# Patient Record
Sex: Male | Born: 1940 | Race: White | Hispanic: No | Marital: Married | Smoking: Former smoker
Health system: Southern US, Community
[De-identification: ages and names within clinical notes are randomized; demographics above are authoritative.]

## PROBLEM LIST (undated history)

## (undated) DIAGNOSIS — I2510 Atherosclerotic heart disease of native coronary artery without angina pectoris: Secondary | ICD-10-CM

## (undated) DIAGNOSIS — R7301 Impaired fasting glucose: Secondary | ICD-10-CM

## (undated) DIAGNOSIS — F418 Other specified anxiety disorders: Secondary | ICD-10-CM

## (undated) DIAGNOSIS — K279 Peptic ulcer, site unspecified, unspecified as acute or chronic, without hemorrhage or perforation: Secondary | ICD-10-CM

## (undated) DIAGNOSIS — M5136 Other intervertebral disc degeneration, lumbar region: Secondary | ICD-10-CM

## (undated) DIAGNOSIS — M19019 Primary osteoarthritis, unspecified shoulder: Secondary | ICD-10-CM

## (undated) DIAGNOSIS — K635 Polyp of colon: Secondary | ICD-10-CM

## (undated) DIAGNOSIS — I10 Essential (primary) hypertension: Secondary | ICD-10-CM

## (undated) DIAGNOSIS — J3081 Allergic rhinitis due to animal (cat) (dog) hair and dander: Secondary | ICD-10-CM

## (undated) DIAGNOSIS — K861 Other chronic pancreatitis: Secondary | ICD-10-CM

## (undated) DIAGNOSIS — M51369 Other intervertebral disc degeneration, lumbar region without mention of lumbar back pain or lower extremity pain: Secondary | ICD-10-CM

## (undated) DIAGNOSIS — J189 Pneumonia, unspecified organism: Secondary | ICD-10-CM

## (undated) DIAGNOSIS — N3289 Other specified disorders of bladder: Secondary | ICD-10-CM

## (undated) DIAGNOSIS — E785 Hyperlipidemia, unspecified: Secondary | ICD-10-CM

## (undated) HISTORY — DX: Impaired fasting glucose: R73.01

## (undated) HISTORY — DX: Pneumonia, unspecified organism: J18.9

## (undated) HISTORY — DX: Polyp of colon: K63.5

## (undated) HISTORY — DX: Hyperlipidemia, unspecified: E78.5

## (undated) HISTORY — DX: Other specified anxiety disorders: F41.8

## (undated) HISTORY — DX: Allergic rhinitis due to animal (cat) (dog) hair and dander: J30.81

## (undated) HISTORY — DX: Primary osteoarthritis, unspecified shoulder: M19.019

## (undated) HISTORY — DX: Other intervertebral disc degeneration, lumbar region without mention of lumbar back pain or lower extremity pain: M51.369

## (undated) HISTORY — PX: OTHER SURGICAL HISTORY: SHX169

## (undated) HISTORY — PX: APPENDECTOMY: SHX54

## (undated) HISTORY — DX: Other specified disorders of bladder: N32.89

## (undated) HISTORY — PX: CHOLECYSTECTOMY: SHX55

## (undated) HISTORY — DX: Essential (primary) hypertension: I10

## (undated) HISTORY — DX: Other chronic pancreatitis: K86.1

## (undated) HISTORY — DX: Peptic ulcer, site unspecified, unspecified as acute or chronic, without hemorrhage or perforation: K27.9

## (undated) HISTORY — DX: Other intervertebral disc degeneration, lumbar region: M51.36

## (undated) HISTORY — DX: Atherosclerotic heart disease of native coronary artery without angina pectoris: I25.10

## (undated) HISTORY — PX: ABDOMINAL EXPLORATION SURGERY: SHX538

---

## 1989-09-05 HISTORY — PX: HIATAL HERNIA REPAIR: SHX195

## 2004-03-15 ENCOUNTER — Encounter: Payer: Self-pay | Admitting: Family Medicine

## 2009-02-27 ENCOUNTER — Encounter: Payer: Self-pay | Admitting: Cardiology

## 2009-06-26 ENCOUNTER — Encounter: Payer: Self-pay | Admitting: Family Medicine

## 2009-08-05 HISTORY — PX: OTHER SURGICAL HISTORY: SHX169

## 2009-08-07 ENCOUNTER — Encounter: Payer: Self-pay | Admitting: Cardiology

## 2009-08-10 ENCOUNTER — Encounter: Payer: Self-pay | Admitting: Cardiology

## 2009-08-11 ENCOUNTER — Encounter: Payer: Self-pay | Admitting: Cardiology

## 2009-08-21 ENCOUNTER — Encounter: Payer: Self-pay | Admitting: Cardiology

## 2009-08-25 ENCOUNTER — Encounter: Payer: Self-pay | Admitting: Cardiology

## 2010-02-02 ENCOUNTER — Ambulatory Visit: Payer: Self-pay | Admitting: Family Medicine

## 2010-02-02 ENCOUNTER — Encounter: Admission: RE | Admit: 2010-02-02 | Discharge: 2010-02-02 | Payer: Self-pay | Admitting: Family Medicine

## 2010-02-02 DIAGNOSIS — F32A Depression, unspecified: Secondary | ICD-10-CM | POA: Insufficient documentation

## 2010-02-02 DIAGNOSIS — F329 Major depressive disorder, single episode, unspecified: Secondary | ICD-10-CM

## 2010-02-03 ENCOUNTER — Encounter: Payer: Self-pay | Admitting: Family Medicine

## 2010-02-04 LAB — CONVERTED CEMR LAB
ALT: 19 units/L (ref 0–53)
AST: 19 units/L (ref 0–37)
BUN: 10 mg/dL (ref 6–23)
Creatinine, Ser: 0.95 mg/dL (ref 0.40–1.50)
Glucose, Bld: 106 mg/dL — ABNORMAL HIGH (ref 70–99)
HDL: 43 mg/dL (ref >39)
Hemoglobin: 14.1 g/dL (ref 13.0–17.0)
MCHC: 32.6 g/dL (ref 30.0–36.0)
MCV: 89.3 fL (ref 78.0–100.0)
Potassium: 4.5 meq/L (ref 3.5–5.3)
RBC: 4.85 M/uL (ref 4.22–5.81)
RDW: 15 % (ref 11.5–15.5)
Total Protein: 6.9 g/dL (ref 6.0–8.3)
Triglycerides: 112 mg/dL (ref <150)
WBC: 3.9 10*3/uL — ABNORMAL LOW (ref 4.0–10.5)

## 2010-02-14 ENCOUNTER — Ambulatory Visit: Payer: Self-pay | Admitting: Family Medicine

## 2010-02-14 ENCOUNTER — Inpatient Hospital Stay (HOSPITAL_COMMUNITY): Admission: EM | Admit: 2010-02-14 | Discharge: 2010-02-21 | Payer: Self-pay | Admitting: Emergency Medicine

## 2010-02-14 ENCOUNTER — Encounter: Payer: Self-pay | Admitting: Family Medicine

## 2010-02-14 DIAGNOSIS — G4700 Insomnia, unspecified: Secondary | ICD-10-CM | POA: Insufficient documentation

## 2010-02-15 ENCOUNTER — Telehealth (INDEPENDENT_AMBULATORY_CARE_PROVIDER_SITE_OTHER): Payer: Self-pay | Admitting: *Deleted

## 2010-02-24 ENCOUNTER — Ambulatory Visit: Payer: Self-pay | Admitting: Family Medicine

## 2010-02-24 DIAGNOSIS — K861 Other chronic pancreatitis: Secondary | ICD-10-CM | POA: Insufficient documentation

## 2010-02-25 ENCOUNTER — Encounter: Payer: Self-pay | Admitting: Family Medicine

## 2010-02-26 LAB — CONVERTED CEMR LAB
AST: 20 units/L (ref 0–37)
Albumin: 4.4 g/dL (ref 3.5–5.2)
Alkaline Phosphatase: 101 units/L (ref 39–117)
BUN: 18 mg/dL (ref 6–23)
CO2: 28 meq/L (ref 19–32)
Calcium: 9.3 mg/dL (ref 8.4–10.5)
Glucose, Bld: 108 mg/dL — ABNORMAL HIGH (ref 70–99)
Lipase: 203 units/L — ABNORMAL HIGH (ref 0–75)
Total Protein: 7 g/dL (ref 6.0–8.3)

## 2010-03-03 ENCOUNTER — Encounter: Payer: Self-pay | Admitting: Family Medicine

## 2010-03-06 ENCOUNTER — Emergency Department (HOSPITAL_COMMUNITY): Admission: EM | Admit: 2010-03-06 | Discharge: 2010-03-06 | Payer: Self-pay | Admitting: Emergency Medicine

## 2010-03-10 ENCOUNTER — Encounter: Payer: Self-pay | Admitting: Family Medicine

## 2010-03-17 ENCOUNTER — Encounter: Payer: Self-pay | Admitting: Family Medicine

## 2010-03-18 ENCOUNTER — Encounter: Payer: Self-pay | Admitting: Family Medicine

## 2010-03-19 ENCOUNTER — Encounter: Payer: Self-pay | Admitting: Family Medicine

## 2010-03-24 ENCOUNTER — Encounter: Payer: Self-pay | Admitting: Family Medicine

## 2010-03-31 ENCOUNTER — Encounter: Payer: Self-pay | Admitting: Cardiology

## 2010-03-31 ENCOUNTER — Ambulatory Visit: Payer: Self-pay | Admitting: Cardiology

## 2010-04-06 ENCOUNTER — Telehealth (INDEPENDENT_AMBULATORY_CARE_PROVIDER_SITE_OTHER): Payer: Self-pay | Admitting: *Deleted

## 2010-04-15 ENCOUNTER — Encounter: Payer: Self-pay | Admitting: Family Medicine

## 2010-04-20 ENCOUNTER — Encounter: Payer: Self-pay | Admitting: Family Medicine

## 2010-05-04 ENCOUNTER — Encounter: Payer: Self-pay | Admitting: Family Medicine

## 2010-05-26 ENCOUNTER — Encounter: Payer: Self-pay | Admitting: Cardiology

## 2010-06-07 ENCOUNTER — Ambulatory Visit: Payer: Self-pay | Admitting: Family Medicine

## 2010-06-09 ENCOUNTER — Encounter: Payer: Self-pay | Admitting: Family Medicine

## 2010-06-10 ENCOUNTER — Encounter: Payer: Self-pay | Admitting: Family Medicine

## 2010-06-10 ENCOUNTER — Encounter: Payer: Self-pay | Admitting: Cardiology

## 2010-06-11 ENCOUNTER — Encounter: Payer: Self-pay | Admitting: Family Medicine

## 2010-06-11 ENCOUNTER — Encounter (INDEPENDENT_AMBULATORY_CARE_PROVIDER_SITE_OTHER): Payer: Self-pay | Admitting: *Deleted

## 2010-06-15 ENCOUNTER — Encounter: Payer: Self-pay | Admitting: Family Medicine

## 2010-06-30 ENCOUNTER — Encounter: Payer: Self-pay | Admitting: Family Medicine

## 2010-07-06 ENCOUNTER — Encounter: Payer: Self-pay | Admitting: Family Medicine

## 2010-07-07 ENCOUNTER — Encounter: Payer: Self-pay | Admitting: Family Medicine

## 2010-08-06 ENCOUNTER — Encounter: Payer: Self-pay | Admitting: Family Medicine

## 2010-09-15 ENCOUNTER — Ambulatory Visit
Admission: RE | Admit: 2010-09-15 | Discharge: 2010-09-15 | Payer: Self-pay | Source: Home / Self Care | Attending: Cardiology | Admitting: Cardiology

## 2010-09-15 ENCOUNTER — Encounter: Payer: Self-pay | Admitting: Cardiology

## 2010-09-24 ENCOUNTER — Encounter: Payer: Self-pay | Admitting: Family Medicine

## 2010-10-05 NOTE — Consult Note (Signed)
Summary: [facility]  [facility]   Imported By: Lanelle Bal 07/09/2010 11:10:54  _____________________________________________________________________  External Attachment:    Type:   Image     Comment:   External Document

## 2010-10-05 NOTE — Consult Note (Signed)
Summary: [facility]  [facility]   Imported By: Lanelle Bal 03/25/2010 84:16:60  _____________________________________________________________________  External Attachment:    Type:   Image     Comment:   External Document

## 2010-10-05 NOTE — Letter (Signed)
Summary: Surgical Pathology Report  Surgical Pathology Report   Imported By: Marylou Mccoy 05/07/2010 11:44:34  _____________________________________________________________________  External Attachment:    Type:   Image     Comment:   External Document

## 2010-10-05 NOTE — Letter (Signed)
Summary: [facility] - Op Note  [facility] - Op Note   Imported By: Marylou Mccoy 05/07/2010 11:38:16  _____________________________________________________________________  External Attachment:    Type:   Image     Comment:   External Document

## 2010-10-05 NOTE — Letter (Signed)
Summary: Letter to Patient with Stool Study Results/[facility]  Letter to Patient with Stool Study Results/[facility]   Imported By: Lanelle Bal 03/31/2010 09:48:54  _____________________________________________________________________  External Attachment:    Type:   Image     Comment:   External Document

## 2010-10-05 NOTE — Letter (Signed)
Summary: [facility] - Discharge Summary  [facility] - Discharge Summary   Imported By: Marylou Mccoy 05/07/2010 11:36:52  _____________________________________________________________________  External Attachment:    Type:   Image     Comment:   External Document

## 2010-10-05 NOTE — Miscellaneous (Signed)
  Clinical Lists Changes  Observations: Added new observation of COLONOSCOPY: Location:  [facility].    Normal internal hemorrhoids diverticulosis o/w normal (03/17/2010 12:10)      Colonoscopy  Procedure date:  03/17/2010  Findings:      Location:  [facility].    Normal internal hemorrhoids diverticulosis o/w normal   Colonoscopy  Procedure date:  03/17/2010  Findings:      Location:  [facility].    Normal internal hemorrhoids diverticulosis o/w normal

## 2010-10-05 NOTE — Letter (Signed)
Summary: Letter Regarding Lab Results/[facility]  Letter Regarding Lab Results/[facility]   Imported By: Lanelle Bal 07/19/2010 08:53:40  _____________________________________________________________________  External Attachment:    Type:   Image     Comment:   External Document

## 2010-10-05 NOTE — Consult Note (Signed)
Summary: [facility]  [facility]   Imported By: Lanelle Bal 06/23/2010 14:43:14  _____________________________________________________________________  External Attachment:    Type:   Image     Comment:   External Document

## 2010-10-05 NOTE — Progress Notes (Signed)
Summary: Call A Nurse  Phone Note From Other Clinic   Caller: Call A Nurse Summary of Call: [facility] Triage Call Report Triage Record Num: [id number] Operator: Karenann Cai Patient Name: Miguel Glenn Call Date & Time: 02/14/2010 12:26:59PM Patient Phone: [phone] PCP: Seymour Bars, DO Patient Gender: Male PCP Fax : [phone] Patient DOB: 27-Jan-1941 Practice Name: [facility] Reason for Call: Patient is reportng symptoms x 1 weeks. Fever: ?. Symptoms: weak, vomited x 1 today, severe abdominal cramping. Patient reports that he has lost 35 lbs within 2 months: no appetite. Patient has been falling recently. Patient has been having lightheadedness and had to pull off to the side of the road when driving. RN advised patient and his girlfriend to have patient evaluated today at [facility] ER. Protocol(s) Used: Abdominal Pain / Discomfort Recommended Outcome per Protocol: Activate EMS 911 Override Outcome if Used in Protocol: See ED Immediately RN Reason for Override Outcome: Nursing Judgement Used. Reason for Outcome: Over 70 years of age AND new onset (first episode) unbearable back or abdominal pain Care Advice: Write down provider's name. List or place the following in a bag for transport with the patient: current prescription and/or OTC medications; alternative treatments, therapies and medications; and street drugs.  ~ 02/14/2010 12:41:07PM Page 1 of 1 [email or web address] Initial call taken by: Payton Spark CMA,  February 15, 2010 8:49 AM

## 2010-10-05 NOTE — Progress Notes (Signed)
  Recieved ROI via Mail today, faxed over to [facility]  April 06, 2010 10:23 AM     Appended Document:  Records recieved from [facility] gave to [facility].Marland KitchenMarland KitchenMarland KitchenKM

## 2010-10-05 NOTE — Miscellaneous (Signed)
Summary: old records  Clinical Lists Changes  Observations: Added new observation of PAST SURG HX: appy R shoulder replacement- Dr Marlyne Beards 08-2009 RTC surgery bilat Gall bladder  hiatal hernia repair 1991 ExLap in 1990s (02/25/2010 14:37) Added new observation of PNEUMVXNEXT: Not Indicated (02/25/2010 14:37) Added new observation of HDLNXTDUE: 02/04/2015 (02/25/2010 14:37) Added new observation of LDLNXTDUE: 02/04/2015 (02/25/2010 14:37) Added new observation of CREATNXTDUE: 02/04/2011 (02/25/2010 14:37) Added new observation of POTASSIUMDUE: 02/04/2011 (02/25/2010 14:37) Added new observation of PAST MED HX: HTN High chol Depression/ Anxiety - Dr Gaynell Face CAD -- Dr Jarold Motto shoulder DJD chronic pancreatitis asthma Lumbar DDD bladder spasm IFG allergies hx of colon polyhps hx of PUD / GI bleed  (02/25/2010 14:37) Added new observation of PRIMARY MD: Seymour Bars DO (02/25/2010 14:37) Added new observation of PNEUMOVAX: given (03/05/2009 14:39)       Past History:  Past Medical History: HTN High chol Depression/ Anxiety - Dr Gaynell Face CAD -- Dr Jarold Motto shoulder DJD chronic pancreatitis asthma Lumbar DDD bladder spasm IFG allergies hx of colon polyhps hx of PUD / GI bleed  Past Surgical History: appy R shoulder replacement- Dr Marlyne Beards 08-2009 RTC surgery bilat Gall bladder  hiatal hernia repair 1991 ExLap in 1990s    Pneumovax Result Date:  03/05/2009 Pneumovax Result:  given Pneumovax Next Due:  Not Indicated

## 2010-10-05 NOTE — Assessment & Plan Note (Signed)
Summary: NOV CAD/ HTN   Vital Signs:  Patient profile:   70 year old male Height:      77 inches Weight:      250 pounds BMI:     29.75 O2 Sat:      96 % on Room air Pulse rate:   54 / minute BP sitting:   124 / 76  (left arm) Cuff size:   large  Vitals Entered By: Payton Spark CMA (Feb 02, 2010 1:31 PM)  O2 Flow:  Room air CC: New to est.    Primary Care Provider:  Seymour Bars DO  CC:  New to est. .  History of Present Illness: 70 yo WM presents for NOV.  Due to [facility] change, he needed to change from [facility]).  He has hx of depression / anxiety -- followed by Dr Gaynell Face, on meds and doing well following the suicide death of wife 2 yrs ago.  He has CAD, seen by Dr Jarold Motto, s/p 3 cardiac stents, no AMIs.  Reports having a 'clean cath' about 6 mos ago. Needs new cardiologist due to [facility].  He has HTN, high cholesterol and is on Plavix long term.  He has no complaints today other than cocyx pain x 2 days following a fall in his room while standing on a chair, he fell off.     Current Medications (verified): 1)  Fluoxetine Hcl 20 Mg Tabs (Fluoxetine Hcl) .... Take 2 Tabs By Mouth Once Daily 2)  Lipitor 40 Mg Tabs (Atorvastatin Calcium) .... Take 1 Tab By Mouth At Bedtime 3)  Metoprolol Succinate 25 Mg Xr24h-Tab (Metoprolol Succinate) .... Take 1 Tab By Mouth Once Daily 4)  Amlodipine Besylate 5 Mg Tabs (Amlodipine Besylate) .... Take 1 Tab By Mouth Once Daily 5)  Diovan 320 Mg Tabs (Valsartan) .... Take 1 Tab By Mouth Once Daily 6)  Plavix 75 Mg Tabs (Clopidogrel Bisulfate) .... Take 1 Tab By Mouth Once Daily 7)  Trazodone Hcl 100 Mg Tabs (Trazodone Hcl) .... Take 1 Tab By Mouth Once Daily 8)  Clonazepam 2 Mg Tabs (Clonazepam) .... Take 1 Tab By Mouth Once Daily 9)  Zolpidem Tartrate 10 Mg Tabs (Zolpidem Tartrate) .... Take 1 Tab By Mouth At Bedtime 10)  Alprazolam 0.5 Mg Tabs (Alprazolam) .... Take 1-2 Tabs By Mouth At Bedtime 11)  Fish Oil 1000 Mg Caps (Omega-3 Fatty  Acids) 12)  Aspir-Low 81 Mg Tbec (Aspirin) 13)  Multivitamins  Tabs (Multiple Vitamin)  Allergies (verified): No Known Drug Allergies  Past History:  Past Medical History: HTN High chol Depression/ Anxiety - Dr Gaynell Face CAD -- Dr Jarold Motto shoulder DJD  Past Surgical History: appy R shoulder replacement RTC surgery bilat Gall bladder  hiatal hernia repair 1990/01/21  Family History: mother died of lung cancer at 82, high chol, HTN father died GSW sister HTN  Social History: Quit smoking in Jan 22, 1995. Has 6 kids, all grown. Was married x 2, 2nd wife died in 01-22-2008- suicide. Lives w/ girlfiend. Retired [employer]. Denies [facility]H Exerc[facility] at [facility] [employer].  Review of Systems       no fevers/sweats/+weakness, unexplained wt loss/gain, no change in vision, + difficulty hearing, ringing in ears, no hay fever/+allergies, no CP/discomfort, no palpitations, no breast lump/nipple discharge, no cough/wheeze, no blood in stool, N/V/D, no nocturia, no leaking urine, no unusual vag bleeding, no vaginal/penile discharge, + muscle/joint pain, no rash, no new/changing mole, + HA, no memory loss, + anxiety, + sleep problem, + depression, no unexplained  lumps, no easy bruising/bleeding, no concern with sexual function   Physical Exam  General:  alert, well-developed, well-nourished, and well-hydrated.   Head:  normocephalic and atraumatic.   Eyes:  pupils equal, pupils round, and pupils reactive to light.   Ears:  no external deformities.   Nose:  no nasal discharge.   Mouth:  pharynx pink and moist.   Neck:  no masses.  no audible carotid bruits Lungs:  Normal respiratory effort, chest expands symmetrically. Lungs are clear to auscultation, no crackles or wheezes. Heart:  Normal rate and regular rhythm. S1 and S2 normal without gallop, murmur, click, rub or other extra sounds. Extremities:  no LE edema Neurologic:  gait normal.   Skin:  color normal.  bruising and tenderness over the coccyx Cervical  Nodes:  No lymphadenopathy noted Psych:  good eye contact, not anxious appearing, and flat affect.     Impression & Recommendations:  Problem # 1:  CAD (ICD-414.00) Doing well on current meds.  Asymptomatic.  Needs new cardiologist due to change of insurance to [facility], Dr Jarold Motto no longer covered.  Will refer to Dr Jens Som.  Continue current meds.   His updated medication list for this problem includes:    Metoprolol Succinate 25 Mg Xr24h-tab (Metoprolol succinate) .Marland Kitchen... Take 1 tab by mouth once daily    Amlodipine Besylate 5 Mg Tabs (Amlodipine besylate) .Marland Kitchen... Take 1 tab by mouth once daily    Diovan 320 Mg Tabs (Valsartan) .Marland Kitchen... Take 1 tab by mouth once daily    Plavix 75 Mg Tabs (Clopidogrel bisulfate) .Marland Kitchen... Take 1 tab by mouth once daily    Aspir-low 81 Mg Tbec (Aspirin)  Orders: T-CBC No Diff ([id number]) T-Comprehensive Metabolic Panel ([id number]) T-Lipid Profile ([id number]) Cardiology Referral (Cardiology)  Problem # 2:  COCCYGEAL PAIN (ICD-724.79) Will XRAY to r/o coccyx fracture.  Treat pain with Tylenol as needed, soft pillow to sit on whether the xray is + or -. Orders: T-DG Sacrum/Coccyx ([id number])  Problem # 3:  ESSENTIAL HYPERTENSION, BENIGN (ICD-401.1) At goal.  Cont. current meds. His updated medication list for this problem includes:    Metoprolol Succinate 25 Mg Xr24h-tab (Metoprolol succinate) .Marland Kitchen... Take 1 tab by mouth once daily    Amlodipine Besylate 5 Mg Tabs (Amlodipine besylate) .Marland Kitchen... Take 1 tab by mouth once daily    Diovan 320 Mg Tabs (Valsartan) .Marland Kitchen... Take 1 tab by mouth once daily  BP today: 124/76  Problem # 4:  HYPERLIPIDEMIA (ICD-272.4) Update labs and continue current meds.  RFs done. His updated medication list for this problem includes:    Lipitor 40 Mg Tabs (Atorvastatin calcium) .Marland Kitchen... Take 1 tab by mouth at bedtime  Complete Medication List: 1)  Fluoxetine Hcl 20 Mg Tabs (Fluoxetine hcl) .... Take 2 tabs by mouth once daily 2)   Lipitor 40 Mg Tabs (Atorvastatin calcium) .... Take 1 tab by mouth at bedtime 3)  Metoprolol Succinate 25 Mg Xr24h-tab (Metoprolol succinate) .... Take 1 tab by mouth once daily 4)  Amlodipine Besylate 5 Mg Tabs (Amlodipine besylate) .... Take 1 tab by mouth once daily 5)  Diovan 320 Mg Tabs (Valsartan) .... Take 1 tab by mouth once daily 6)  Plavix 75 Mg Tabs (Clopidogrel bisulfate) .... Take 1 tab by mouth once daily 7)  Trazodone Hcl 100 Mg Tabs (Trazodone hcl) .... Take 1 tab by mouth once daily 8)  Clonazepam 2 Mg Tabs (Clonazepam) .... Take 1 tab by mouth once daily 9)  Zolpidem Tartrate 10 Mg Tabs (  Zolpidem tartrate) .... Take 1 tab by mouth at bedtime 10)  Alprazolam 0.5 Mg Tabs (Alprazolam) .... Take 1-2 tabs by mouth at bedtime 11)  Fish Oil 1000 Mg Caps (Omega-3 fatty acids) 12)  Aspir-low 81 Mg Tbec (Aspirin) 13)  Multivitamins Tabs (Multiple vitamin)  Patient Instructions: 1)  Xray Coccyx downstairs today. 2)  Will call you with results tomorrow. 3)  Update fasting labs tomorrow morning. 4)  Will call you w/ results on Thursday. 5)  Meds RFd -- below. 6)  Set you up with Dr Jens Som for f/u heart dz.   7)  Return for f/u HTN in 4 mos. Prescriptions: LIPITOR 40 MG TABS (ATORVASTATIN CALCIUM) Take 1 tab by mouth at bedtime  #90 x 1   Entered and Authorized by:   Seymour Bars DO   Signed by:   Seymour Bars DO on 02/02/2010   Method used:   Electronically to        [facility] [phone]* (retail)       [address] [place], [place]  [address]       Ph: [phone]       Fax: [phone]   RxID:   [id number] PLAVIX 75 MG TABS (CLOPIDOGREL BISULFATE) Take 1 tab by mouth once daily  #90 x 1   Entered and Authorized by:   Seymour Bars DO   Signed by:   Seymour Bars DO on 02/02/2010   Method used:   Electronically to        [facility] [phone]* (retail)       [address] [place], [place]  [address]       Ph: [phone]       Fax: [phone]   RxID:    [id number] DIOVAN 320 MG TABS (VALSARTAN) Take 1 tab by mouth once daily  #90 x 1   Entered and Authorized by:   Seymour Bars DO   Signed by:   Seymour Bars DO on 02/02/2010   Method used:   Electronically to        [facility] [phone]* (retail)       [address] [place], [place]  [address]       Ph: [phone]       Fax: [phone]   RxID:   [id number] AMLODIPINE BESYLATE 5 MG TABS (AMLODIPINE BESYLATE) Take 1 tab by mouth once daily  #90 x 1   Entered and Authorized by:   Seymour Bars DO   Signed by:   Seymour Bars DO on 02/02/2010   Method used:   Electronically to        [facility] [phone]* (retail)       [address] [place], [place]  [address]       Ph: [phone]       Fax: [phone]   RxID:   [id number] METOPROLOL SUCCINATE 25 MG XR24H-TAB (METOPROLOL SUCCINATE) Take 1 tab by mouth once daily  #90 x 1   Entered and Authorized by:   Seymour Bars DO   Signed by:   Seymour Bars DO on 02/02/2010   Method used:   Electronically to        [facility] [phone]* (retail)       [address] [place], [place]  [address]       Ph: [phone]       Fax: [phone]   RxID:   [id number]

## 2010-10-05 NOTE — Consult Note (Signed)
Summary: [facility]  [facility]   Imported By: Lanelle Bal 07/19/2010 08:46:54  _____________________________________________________________________  External Attachment:    Type:   Image     Comment:   External Document

## 2010-10-05 NOTE — Letter (Signed)
Summary: Letter Regarding Stool Study Results/[facility]  Letter Regarding Stool Study Results/[facility]   Imported By: Lanelle Bal 07/19/2010 08:44:09  _____________________________________________________________________  External Attachment:    Type:   Image     Comment:   External Document

## 2010-10-05 NOTE — Procedures (Signed)
Summary: EGD/[facility]  EGD/[facility]   Imported By: Lanelle Bal 04/27/2010 10:26:43  _____________________________________________________________________  External Attachment:    Type:   Image     Comment:   External Document

## 2010-10-05 NOTE — Assessment & Plan Note (Signed)
Summary: f/u pancreatitis   Vital Signs:  Patient profile:   70 year old male Height:      77 inches Weight:      247 pounds BMI:     29.40 O2 Sat:      94 % on Room air Pulse rate:   78 / minute BP sitting:   111 / 69  (left arm) Cuff size:   large  Vitals Entered By: Payton Spark CMA (June 07, 2010 3:33 PM)  O2 Flow:  Room air CC: F/U.    Primary Care Provider:  Seymour Bars DO  CC:  F/U. Marland Kitchen  History of Present Illness: 70 yo WM presents for f/u visit. Since his last visit here, he was referred to Dr Rhetta Mura for his chronic pancreatitis which has been flaring up with cramping.  He is on Creon.  He is not taking anything for pain.  He denies N/V or Diarrhea.  he is having a lot of gas.  Has GI f/u tomorrow.    He saw Dr Jens Som and his BB was changed and statin was added back.   He recently had his labs rechecked thru the cardiology office.  Mood has been stable on current meds.      Current Medications (verified): 1)  Fluoxetine Hcl 20 Mg Caps (Fluoxetine Hcl) .... Take 2 Capsules in The Morning 2)  Trazodone Hcl 100 Mg Tabs (Trazodone Hcl) .... Take 1 Tab By Mouth Once Daily 3)  Clonazepam 2 Mg Tabs (Clonazepam) .... .b   2 Tablets A Bedtime 4)  Zolpidem Tartrate 10 Mg Tabs (Zolpidem Tartrate) .... Take 1 Tab By Mouth At Bedtime 5)  Aspirin Ec 325 Mg Tbec (Aspirin) .... Take One Tablet By Mouth Daily 6)  Multivitamins  Tabs (Multiple Vitamin) 7)  Creon 6000 Unit Cpep (Pancrelipase (Lip-Prot-Amyl)) .... Take 3 X A Day With Meals 8)  Amlodipine Besylate 5 Mg Tabs (Amlodipine Besylate) .... Take One Tablet By Mouth Daily 9)  Zenpep 20000 Unit Cpep (Pancrelipase (Lip-Prot-Amyl)) .... Take 2 Cap Before Meals 10)  Acetaminophen Pm 500-25 Mg Tabs (Diphenhydramine-Apap (Sleep)) .... As Needed 11)  Diovan 320 Mg Tabs (Valsartan) .... Take One Tablet By Mouth Daily 12)  Pravastatin Sodium 80 Mg Tabs (Pravastatin Sodium) .Marland Kitchen.. 1 Once Daily 13)  Atenolol 25 Mg Tabs (Atenolol)  .Marland Kitchen.. 1 Once Daily  Allergies (verified): No Known Drug Allergies  Past History:  Past Medical History: HTN Hyperlipidemia Depression/ Anxiety CAD (stents placed in [facility] previously) shoulder DJD chronic pancreatitis asthma Lumbar DDD bladder spasm IFG allergies hx of colon polyhps hx of PUD / GI bleed  urology: in [place] (sees for DRE and PSA annually). Dr Jens Som cards GI Dr Rhetta Mura  Past Surgical History: Reviewed history from 02/25/2010 and no changes required. appy R shoulder replacement- Dr Marlyne Beards 08-2009 RTC surgery bilat Gall bladder  hiatal hernia repair 1991 ExLap in 1990s  Family History: Reviewed history from 02/02/2010 and no changes required. mother died of lung cancer at 72, high chol, HTN father died GSW sister HTN  Social History: Reviewed history from 02/02/2010 and no changes required. Quit smoking in 01-29-1995. Has 6 kids, all grown. Was married x 2, 2nd wife died in Jan 29, 2008- suicide. Lives w/ girlfiend. Retired [employer]. Denies ETOH Exercises at [employer].  Review of Systems      See HPI  Physical Exam  General:  alert, well-developed, well-nourished, well-hydrated, and overweight-appearing.   Head:  normocephalic and atraumatic.   Eyes:  sclera  non icteric Mouth:  pharynx pink and moist.   Neck:  no masses.   Lungs:  Normal respiratory effort, chest expands symmetrically. Lungs are clear to auscultation, no crackles or wheezes. Heart:  Normal rate and regular rhythm. S1 and S2 normal without gallop, murmur, click, rub or other extra sounds. Abdomen:  soft, diffusely TTP with mild distenstion, tympanic to percussion.  no rigidity, no hepatomegaly, and no splenomegaly.   Extremities:  no LE edema Skin:  color normal.   Cervical Nodes:  No lymphadenopathy noted Psych:  good eye contact, not anxious appearing, and not depressed appearing.     Impression & Recommendations:  Problem # 1:  CHRONIC PANCREATITIS (ICD-577.1) He has f/u  with Dr Rhetta Mura tomorrow.  On Creon.  Having more cramping and gas which is not typical for pancreatitis.  Currently does not have anything for as needed use. Orders: T-Comprehensive Metabolic Panel ([id number])  Problem # 2:  CAD (ICD-414.00) Read thru Dr Ludwig Clarks notes.  Doing well on current meds and is asymptomatic. His updated medication list for this problem includes:    Aspirin Ec 325 Mg Tbec (Aspirin) .Marland Kitchen... Take one tablet by mouth daily    Amlodipine Besylate 5 Mg Tabs (Amlodipine besylate) .Marland Kitchen... Take one tablet by mouth daily    Diovan 320 Mg Tabs (Valsartan) .Marland Kitchen... Take one tablet by mouth daily    Atenolol 25 Mg Tabs (Atenolol) .Marland Kitchen... 1 once daily  Problem # 3:  ESSENTIAL HYPERTENSION, BENIGN (ICD-401.1) BP perfect on this regimen and he's no longer bradycardic.  Continue.  Labs UTD. His updated medication list for this problem includes:    Amlodipine Besylate 5 Mg Tabs (Amlodipine besylate) .Marland Kitchen... Take one tablet by mouth daily    Diovan 320 Mg Tabs (Valsartan) .Marland Kitchen... Take one tablet by mouth daily    Atenolol 25 Mg Tabs (Atenolol) .Marland Kitchen... 1 once daily  BP today: 111/69 Prior BP: 116/66 (03/31/2010)  Labs Reviewed: K+: 4.0 (02/25/2010) Creat: : 1.13 (02/25/2010)   Chol: 107 (02/03/2010)   HDL: 43 (02/03/2010)   LDL: 42 (02/03/2010)   TG: 112 (02/03/2010)  Problem # 4:  DEPRESSION (ICD-311) Mood stable on the following regimen.  Will continue.  Pt will need to decide either to f/u with pych as previously seeing or f/u here for PHQ 9 testing. His updated medication list for this problem includes:    Fluoxetine Hcl 20 Mg Caps (Fluoxetine hcl) .Marland Kitchen... Take 2 capsules in the morning    Trazodone Hcl 100 Mg Tabs (Trazodone hcl) .Marland Kitchen... Take 1 tab by mouth once daily    Clonazepam 2 Mg Tabs (Clonazepam) .Marland Kitchen... 1/2 tab by mouth at bedtime  Complete Medication List: 1)  Fluoxetine Hcl 20 Mg Caps (Fluoxetine hcl) .... Take 2 capsules in the morning 2)  Trazodone Hcl 100 Mg Tabs (Trazodone  hcl) .... Take 1 tab by mouth once daily 3)  Clonazepam 2 Mg Tabs (Clonazepam) .... 1/2 tab by mouth at bedtime 4)  Zolpidem Tartrate 10 Mg Tabs (Zolpidem tartrate) .... Take 1 tab by mouth at bedtime 5)  Aspirin Ec 325 Mg Tbec (Aspirin) .... Take one tablet by mouth daily 6)  Multivitamins Tabs (Multiple vitamin) 7)  Creon 6000 Unit Cpep (Pancrelipase (lip-prot-amyl)) .... Take 3 x a day with meals 8)  Amlodipine Besylate 5 Mg Tabs (Amlodipine besylate) .... Take one tablet by mouth daily 9)  Zenpep 20000 Unit Cpep (Pancrelipase (lip-prot-amyl)) .... Take 2 cap before meals 10)  Acetaminophen Pm 500-25 Mg Tabs (Diphenhydramine-apap (sleep)) .Marland KitchenMarland KitchenMarland Kitchen  As needed 11)  Diovan 320 Mg Tabs (Valsartan) .... Take one tablet by mouth daily 12)  Pravastatin Sodium 80 Mg Tabs (Pravastatin sodium) .Marland Kitchen.. 1 once daily 13)  Atenolol 25 Mg Tabs (Atenolol) .Marland Kitchen.. 1 once daily  Patient Instructions: 1)  Add fasting chemistry panel to next blood draw. 2)  Have them fax me a copy.   3)  Will fax note to Dr Rhetta Mura since you are seeing him tomorrow. 4)  REturn for a PHYSICAL in 4 months. Prescriptions: CLONAZEPAM 2 MG TABS (CLONAZEPAM) 1/2 tab by mouth at bedtime  #30 x 0   Entered and Authorized by:   Seymour Bars DO   Signed by:   Seymour Bars DO on 06/07/2010   Method used:   Printed then faxed to ...       [address] [phone]* (retail)       [address] [place], [place]  [address]       Ph: [phone]       Fax: [phone]   RxID:   [id number]

## 2010-10-05 NOTE — Assessment & Plan Note (Signed)
Summary: Admit H&P   Visit Type:  H&P Primary Care Provider:  Seymour Bars DO  CC:  Abdominal Pain.  History of Present Illness: Mr Miguel Glenn presents to [facility] with cramping sharp 10/10 non radiating abdominal pain starting this morning.  He also notes retching, and diarrhea.  Mr Miguel Glenn cannot think of anything he did or ate to bring on this abdominal pain.  He also notes that his diarrhea has been ongoing for several weeks and occurs after meals. He denies any blood in his stool but does note that the stool is sometimes black.  He additionally notes that his appetite has been diminishing for 3-4 weeks, and he has lost around 35 pounds in the last 2 months. His weight loss is not intentional.     Additionally Mr Miguel Glenn notes that his psychiatrist started him on Zyprexa this Thursday although he does not think this is causing his abdominal symptoms.  Mr Miguel Glenn also notes a recent fall and thinks that he is perhaps a bit more clumsy in the last few weeks.    Habits & Providers  Alcohol-Tobacco-Diet     Alcohol drinks/day: 0     Tobacco Status: quit > 6 months  Current Problems (verified): 1)  Depression  (ICD-311) 2)  Hyperlipidemia  (ICD-272.4) 3)  Essential Hypertension, Benign  (ICD-401.1) 4)  Coccygeal Pain  (ICD-724.79) 5)  Cad  (ICD-414.00)  Medications Prior to Update: 1)  Fluoxetine Hcl 20 Mg Tabs (Fluoxetine Hcl) .... Take 2 Tabs By Mouth Once Daily 2)  Lipitor 40 Mg Tabs (Atorvastatin Calcium) .... Take 1 Tab By Mouth At Bedtime 3)  Metoprolol Succinate 25 Mg Xr24h-Tab (Metoprolol Succinate) .... Take 1 Tab By Mouth Once Daily 4)  Amlodipine Besylate 5 Mg Tabs (Amlodipine Besylate) .... Take 1 Tab By Mouth Once Daily 5)  Diovan 320 Mg Tabs (Valsartan) .... Take 1 Tab By Mouth Once Daily 6)  Plavix 75 Mg Tabs (Clopidogrel Bisulfate) .... Take 1 Tab By Mouth Once Daily 7)  Trazodone Hcl 100 Mg Tabs (Trazodone Hcl) .... Take 1 Tab By Mouth Once Daily 8)  Clonazepam 2  Mg Tabs (Clonazepam) .... Take 1 Tab By Mouth Once Daily 9)  Zolpidem Tartrate 10 Mg Tabs (Zolpidem Tartrate) .... Take 1 Tab By Mouth At Bedtime 10)  Alprazolam 0.5 Mg Tabs (Alprazolam) .... 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 Has 6 kids, all grown. Was married x 2, 2nd wife died in Jan 21, 2008- suicide. Lives w/ girlfiend. Retired [employer] architect. Denies ETOH Exercises at the [employer].  Risk Factors: Smoking Status: quit > 6 months (02/14/2010)  Past Surgical History: appy R shoulder replacement RTC surgery bilat Gall bladder  hiatal hernia repair 1991 ExLap in 1990s  Social History: Smoking Status:  quit > 6 months  Review of Systems       The patient complains of anorexia, weight loss, headaches, abdominal pain, depression, and unusual weight change.  The patient denies fever, vision loss, decreased hearing, hoarseness, chest pain, syncope, dyspnea  on exertion, peripheral edema, prolonged cough, hemoptysis, melena, hematochezia, severe indigestion/heartburn, hematuria, incontinence, genital sores, muscle weakness, suspicious skin lesions, transient blindness, difficulty walking, abnormal bleeding, enlarged lymph nodes, angioedema, and testicular masses.    Physical Exam  General:  VS: T 98, Hr 51, BP 118/63, RR 18, Sats 97%ra Well appearing male in ED bed in NAD  Eyes:  EOMI, PEERL, Sclera non-icteric, conjectiva and lids clear.  Mouth:  MMM, no lesions noted. Neck:  No deformities, masses,  or tenderness noted. Lungs:  Normal respiratory effort, chest expands symmetrically. Lungs are clear to auscultation, no crackles or wheezes. Heart:  Normal rate and regular rhythm. S1 and S2 normal without gallop, murmur, click, rub or other extra sounds. Abdomen:  Non-distended, abdomen with mature verticle midline scar.  NABS Some voluntary guarding with deep palpation.  Mildly tender to deep palpation in the upper abdominal fields.  No masses noted. No rebound tenderness, nor non-tender to bed jarring.    Rectal:  No external abnormalities noted. Normal sphincter tone. No rectal masses or tenderness. Genitalia:  Testes bilaterally descended without nodularity, tenderness or masses. No scrotal masses or lesions. No penis lesions or urethral discharge. Extremities:  No clubbing, cyanosis, edema, or deformity noted. Neurologic:  No cranial nerve deficits noted. Station and gait are normal. Plantar reflexes are down-going bilaterally. DTRs are symmetrical throughout. Sensory, motor and coordinative functions appear intact.  Exception in right arm which is 4/5 strength in all motions (at baseline) Skin:  Intact without suspicious lesions or rashes Cervical Nodes:  No lymphadenopathy noted Axillary Nodes:  No palpable lymphadenopathy Inguinal Nodes:  No significant adenopathy Psych:  Oriented X3, memory intact for recent and remote, normally interactive, good eye contact, and not anxious appearing.   Additional Exam:  Labs: CBC: 8.7>14.2<179 ANC: 7.4 MCV 91 CMP: 141/4.4 107/31 15/0.91<119  Ca 9.4, Tp 7.3, Alb 4.3, AST 39 ALT 33 Tb 0.9 UA: WNL Lipase 1072 (elevated) Fecal Occult Blood: Neg KUB: Benign chest and abdomen   Impression & Recommendations:  Problem # 1:  Pancretitis Acute Pancretitis associated with a 35 lb weight loss with no other explination is concerning for malignancy. Pancretitis appears to be mild with <2 of Ranson's criteria met (age).   Diagnosis Plan Plan for CT of  abdomen with IV and oral contrast to evaluate for etiology.  Will also obtain fasting lipids, and LDH for other explinations and to complete ranson's criteria eval.  Treatment Plan: NPO status, IVF, and Dilauded for pain.    Problem # 2:  Diarrhea Unclear etiology:  Likely due to same process creating pancretitis.  If ductal obstruction would suspect a malabsorptive process producing diarrhea.  Dark stools less concerning for melena given negative heme occult stool and normal Hb.  Will follow and provide pancretic enzymes as needed when advancing diet.   Problem # 3:  CAD (ICD-414.00) Patient on ASA and Plavix.  Will decrease aspirin to 81mg . Will also provide plavix and continue statin.   His updated medication list for this problem includes:    Metoprolol Succinate 25 Mg Xr24h-tab (Metoprolol succinate) .Marland Kitchen... Take 1 tab by mouth once daily    Amlodipine Besylate 5 Mg Tabs (Amlodipine besylate) .Marland Kitchen... Take 1 tab by mouth once daily    Diovan 320 Mg Tabs (Valsartan) .Marland Kitchen... Take 1 tab by mouth once daily    Plavix 75 Mg Tabs (Clopidogrel bisulfate) .Marland Kitchen... Take 1 tab by mouth once daily    Aspir-low 81 Mg Tbec (Aspirin)  Problem # 4:  ESSENTIAL HYPERTENSION, BENIGN (ICD-401.1) Will continue current home medications in the hospital. However will reduce doses or eliminate amlodipine as needed for hypotension.   His updated medication list for this problem includes:    Metoprolol Succinate 25 Mg Xr24h-tab (Metoprolol succinate) .Marland Kitchen... Take 1 tab by mouth once daily    Amlodipine Besylate 5 Mg Tabs (Amlodipine besylate) .Marland Kitchen... Take 1 tab by mouth once daily    Diovan 320 Mg Tabs (Valsartan) .Marland Kitchen... Take 1 tab by mouth once daily  Problem # 5:  INSOMNIA (ICD-780.52) Patient with insomina from home. Currently taking Ambien, Xanax, Klonipin, and trazadone to sleep.  Pt has had recent fall.  We are concerned about the multiple benzodiazipines.  Will place on fall precautions and provide klonipin and  trazadone for insomina.   Problem # 6:  DEPRESSION (ICD-311) Pt with depression with new Zyprexa per psych. Not on medlist and not in pill bag.  Will hold on this medication and defer to psych to restart. Unlikely to be the offending agent for abdominal pain.  Will provide Fluoxetine, Klonopin and trazadone as above.   His updated medication list for this problem includes:    Fluoxetine Hcl 20 Mg Tabs (Fluoxetine hcl) .Marland Kitchen... Take 2 tabs by mouth once daily    Trazodone Hcl 100 Mg Tabs (Trazodone hcl) .Marland Kitchen... Take 1 tab by mouth once daily    Clonazepam 2 Mg Tabs (Clonazepam) .Marland Kitchen... Take 1 tab by mouth once daily    Alprazolam 0.5 Mg Tabs (Alprazolam) .Marland Kitchen... Take 1-2 tabs by mouth at bedtime  Problem # 7:  Prophylaxis Will use heparin for VTE prophylaxis as no evidence for GI bleed.  Will also use IV protonix for ulcer prophylaxis.  Problem # 8:  Code Status: Full Code  Dictation Code [phone]  Problem # 9:  Disposition Following evaluation and resumption of diet.   Complete Medication List: 1)  Fluoxetine Hcl 20 Mg Tabs (Fluoxetine hcl) .... Take 2 tabs by mouth once daily 2)  Lipitor 40 Mg Tabs (Atorvastatin calcium) .... Take 1 tab by mouth at bedtime 3)  Metoprolol Succinate 25 Mg Xr24h-tab (Metoprolol succinate) .... Take 1 tab by mouth once daily 4)  Amlodipine Besylate 5 Mg Tabs (Amlodipine besylate) .... Take 1 tab by mouth once daily 5)  Diovan 320 Mg Tabs (Valsartan) .... Take 1 tab by mouth once daily 6)  Plavix 75 Mg Tabs (Clopidogrel bisulfate) .... Take 1 tab by mouth once daily 7)  Trazodone Hcl 100 Mg Tabs (Trazodone hcl) .... Take 1 tab by mouth once daily 8)  Clonazepam 2 Mg Tabs (Clonazepam) .... Take 1 tab by mouth once daily 9)  Zolpidem Tartrate 10 Mg Tabs (Zolpidem tartrate) .... Take 1 tab by mouth at bedtime 10)  Alprazolam 0.5 Mg Tabs (Alprazolam) .... Take 1-2 tabs by mouth at bedtime 11)  Fish Oil 1000 Mg Caps (Omega-3 fatty acids) 12)  Aspir-low 81 Mg Tbec  (Aspirin) 13)  Multivitamins Tabs (Multiple vitamin)

## 2010-10-05 NOTE — Assessment & Plan Note (Signed)
Summary: HFU pancreatitis   Vital Signs:  Patient profile:   70 year old male Height:      77 inches Weight:      240 pounds BMI:     28.56 O2 Sat:      99 % on Room air Temp:     97.9 degrees F oral Pulse rate:   48 / minute BP sitting:   127 / 79  (left arm) Cuff size:   large  Vitals Entered By: Payton Spark CMA (February 24, 2010 11:03 AM)  O2 Flow:  Room air CC: Hosp F/U   Primary Care Provider:  Seymour Bars DO  CC:  Hosp F/U.  History of Present Illness: Miguel Glenn presents for HFU visit.  Admitted to [facility] from 6-12 to 6-19 for acute and chronic pancreatitis.  He ruled out for AMI given his hx of CAD.  His abd pain is much improved after bowel rest, pain meds and IV Fluids.  Diarrhea has resolved but he is on Creon which has helped.  Denies any ETOh over the past 10 yrs.  Has not seen a GI yet.  Due for a colonosocopy and to set up GI visit for chronic pancreatitis.  Protonix was added which has helped.  No changes were made to his psych meds, he sees Dr Gaynell Face for these.  He wants less expensive meds if possible.    His Lipase improved from 1072 on admission down to 95 at discharge.  LFTs were a little high but did return to normal.  His Lipitor was held.  He had a normal MRI and MRCP to rule out pancreatitic cancer given sudden wt loss.    Allergies (verified): No Known Drug Allergies  Past History:  Past Medical History: HTN High chol Depression/ Anxiety - Dr Gaynell Face CAD -- Dr Jarold Motto shoulder DJD chronic pancreatitis  Social History: Reviewed history from 02/02/2010 and no changes required. Quit smoking in Jan 20, 1995. Has 6 kids, all grown. Was married x 2, 2nd wife died in 01/20/08- suicide. Lives w/ girlfiend. Retired [employer]. Denies ETOH Exercises at [employer].  Review of Systems      See HPI  Physical Exam  General:  alert, well-developed, well-nourished, and well-hydrated.   Head:  normocephalic and atraumatic.   Eyes:  sclera non icteric Mouth:   pharynx pink and moist.   Neck:  no masses.   Lungs:  Normal respiratory effort, chest expands symmetrically. Lungs are clear to auscultation, no crackles or wheezes. Heart:  Normal rate and regular rhythm. S1 and S2 normal without gallop, murmur, click, rub or other extra sounds. Abdomen:  epigastric TTP without distension.  soft.  NABS.  No HSM. Extremities:  no LE edema Skin:  color normal.  no jaundice bruising over lower abdomen. Psych:  good eye contact, not anxious appearing, and not depressed appearing.     Impression & Recommendations:  Problem # 1:  CHRONIC PANCREATITIS (ICD-577.1) GI referral made for f/u of chronic pancreatitis and needing screening colonoscopy. Creon helping.  Diarrhea resolved.  Continue.  Will check sugar with labs to be sure it is not rising. Orders: T-Comprehensive Metabolic Panel [id number]) T-Lipase [id number]) Gastroenterology Referral (GI)  Problem # 2:  ESSENTIAL HYPERTENSION, BENIGN (ICD-401.1)  BP at goal but will keep him off the Norvasc.  Changed Toprol XL to Atenolol for cost savings and changed Diovan to Losartan for cost.  F/U in 6 wks to be sure BP is OK on this regimen. The following medications  were removed from the medication list:    Amlodipine Besylate 5 Mg Tabs (Amlodipine besylate) .Marland Kitchen... Take 1 tab by mouth once daily    Diovan 320 Mg Tabs (Valsartan) .Marland Kitchen... Take 1 tab by mouth once daily His updated medication list for this problem includes:    Atenolol 25 Mg Tabs (Atenolol) .Marland Kitchen... 1 tab by mouth daily    Losartan Potassium 100 Mg Tabs (Losartan potassium) .Marland Kitchen... 1 tab by mouth daily  BP today: 127/79 Prior BP: 124/76 (02/02/2010)  Labs Reviewed: K+: 4.5 (02/03/2010) Creat: : 0.95 (02/03/2010)   Chol: 107 (02/03/2010)   HDL: 43 (02/03/2010)   LDL: 42 (02/03/2010)   TG: 112 (02/03/2010)  Problem # 3:  HYPERLIPIDEMIA (ICD-272.4) Will check labs to be sure LFTs are stable prior to restarting his statin. His updated  medication list for this problem includes:    Lipitor 40 Mg Tabs (Atorvastatin calcium) .Marland Kitchen... Take 1 tab by mouth at bedtime  Problem # 4:  DEPRESSION (ICD-311) Stable on current meds.  Has f/u with Dr Gaynell Face. His updated medication list for this problem includes:    Fluoxetine Hcl 40 Mg Caps (Fluoxetine hcl) .Marland Kitchen... 1 capsule by mouth daily    Trazodone Hcl 100 Mg Tabs (Trazodone hcl) .Marland Kitchen... Take 1 tab by mouth once daily    Clonazepam 2 Mg Tabs (Clonazepam) .Marland Kitchen... Take 1 tab by mouth once daily    Alprazolam 0.5 Mg Tabs (Alprazolam) .Marland Kitchen... Take 1-2 tabs by mouth at bedtime  Complete Medication List: 1)  Fluoxetine Hcl 40 Mg Caps (Fluoxetine hcl) .Marland Kitchen.. 1 capsule by mouth daily 2)  Lipitor 40 Mg Tabs (Atorvastatin calcium) .... Take 1 tab by mouth at bedtime 3)  Atenolol 25 Mg Tabs (Atenolol) .Marland Kitchen.. 1 tab by mouth daily 4)  Plavix 75 Mg Tabs (Clopidogrel bisulfate) .... Take 1 tab by mouth once daily 5)  Trazodone Hcl 100 Mg Tabs (Trazodone hcl) .... Take 1 tab by mouth once daily 6)  Clonazepam 2 Mg Tabs (Clonazepam) .... Take 1 tab by mouth once daily 7)  Zolpidem Tartrate 10 Mg Tabs (Zolpidem tartrate) .... Take 1 tab by mouth at bedtime 8)  Alprazolam 0.5 Mg Tabs (Alprazolam) .... Take 1-2 tabs by mouth at bedtime 9)  Fish Oil 1000 Mg Caps (Omega-3 fatty acids) 10)  Aspir-low 81 Mg Tbec (Aspirin) 11)  Multivitamins Tabs (Multiple vitamin) 12)  Losartan Potassium 100 Mg Tabs (Losartan potassium) .Marland Kitchen.. 1 tab by mouth daily 13)  Creon 6000 Unit Cpep (Pancrelipase (lip-prot-amyl)) .... Take 3 x a day with meals 14)  Vicodin 5-500 Mg Tabs (Hydrocodone-acetaminophen) .Marland Kitchen.. 1 tab by mouth daily as needed for pain  Patient Instructions: 1)  Change Diovan to Losartan. 2)  Change Toprol XL (metoprolol) to Atenolol. 3)  Stay off Lipitor until I get your labs back. 4)  GI referral made. 5)  Change Oxycodone to Hydrocodone for pain. 6)  Stay on Creon. 7)  Start a MVI daily. Prescriptions: VICODIN  5-500 MG TABS (HYDROCODONE-ACETAMINOPHEN) 1 tab by mouth daily as needed for pain  #20 x 0   Entered and Authorized by:   Seymour Bars DO   Signed by:   Seymour Bars DO on 02/24/2010   Method used:   Printed then faxed to ...       [address] [phone]* (retail)       [address] [place], [place]  [address]       Ph: [phone]  Fax: [phone]   RxID:   [id number] ATENOLOL 25 MG TABS (ATENOLOL) 1 tab by mouth daily  #30 x 3   Entered and Authorized by:   Seymour Bars DO   Signed by:   Seymour Bars DO on 02/24/2010   Method used:   Electronically to        [facility] [phone]* (retail)       [address] [place], [place]  [address]       Ph: [phone]       Fax: [phone]   RxID:   [id number] LOSARTAN POTASSIUM 100 MG TABS (LOSARTAN POTASSIUM) 1 tab by mouth daily  #30 x 3   Entered and Authorized by:   Seymour Bars DO   Signed by:   Seymour Bars DO on 02/24/2010   Method used:   Electronically to        [facility] [phone]* (retail)       [address] [place], [place]  [address]       Ph: [phone]       Fax: [phone]   RxID:   [id number]

## 2010-10-05 NOTE — Letter (Signed)
Summary: Letter with Colonoscopy Results/[facility]  Letter with Colonoscopy Results/[facility]   Imported By: Lanelle Bal 03/29/2010 08:45:45  _____________________________________________________________________  External Attachment:    Type:   Image     Comment:   External Document

## 2010-10-05 NOTE — Letter (Signed)
Summary: [facility] - Discharge Summary  [facility] - Discharge Summary   Imported By: Marylou Mccoy 05/07/2010 11:37:33  _____________________________________________________________________  External Attachment:    Type:   Image     Comment:   External Document

## 2010-10-05 NOTE — Consult Note (Signed)
Summary: [facility]  [facility]   Imported By: Lanelle Bal 03/19/2010 12:02:21  _____________________________________________________________________  External Attachment:    Type:   Image     Comment:   External Document

## 2010-10-05 NOTE — Letter (Signed)
Summary: [facility]  [facility]   Imported By: Maryln Gottron 08/13/2010 11:28:39  _____________________________________________________________________  External Attachment:    Type:   Image     Comment:   External Document

## 2010-10-05 NOTE — Letter (Signed)
Summary: [facility] - [facility]  [facility], Main Office  [address]   [place], [place] [address]   Phone: [phone]  Fax: [phone]     June 11, 2010 MRN: [id number]   Miguel Glenn [address] [place], [place]  [address]   Dear [name],  We have reviewed your cholesterol results.  They are as follows:     Total Cholesterol:    126 (Desirable: less than 200)       HDL  Cholesterol:     47 (Desirable: greater than 40 for men and 50 for women)       LDL Cholesterol:       59  (Desirable: less than 100 for low risk and less than 70 for moderate to high risk)       Triglycerides:       99  (Desirable: less than 150)  Our recommendations include:These numbers look good. Continue on the same medicine. Liver function is normal. Take care, Dr. Darel Hong.    Call our office at the number listed above if you have any questions.  Lowering your LDL cholesterol is important, but it is only one of a large number of "risk factors" that may indicate that you are at risk for heart disease, stroke or other complications of hardening of the arteries.  Other risk factors include:   A.  Cigarette Smoking* B.  High Blood Pressure* C.  Obesity* D.   Low HDL Cholesterol (see yours above)* E.   Diabetes Mellitus (higher risk if your is uncontrolled) F.  Family history of premature heart disease G.  Previous history of stroke or cardiovascular disease    *These are risk factors YOU HAVE CONTROL OVER.  For more information, visit .  There is now evidence that lowering the TOTAL CHOLESTEROL AND LDL CHOLESTEROL can reduce the risk of heart disease.  The American Heart Association recommends the following guidelines for the treatment of elevated cholesterol:  1.  If there is now current heart disease and less than two risk factors, TOTAL CHOLESTEROL should be less than 200 and LDL CHOLESTEROL should be less than 100. 2.  If there is current heart disease or  two or more risk factors, TOTAL CHOLESTEROL should be less than 200 and LDL CHOLESTEROL should be less than 70.  A diet low in cholesterol, saturated fat, and calories is the cornerstone of treatment for elevated cholesterol.  Cessation of smoking and exercise are also important in the management of elevated cholesterol and preventing vascular disease.  Studies have shown that 30 to 60 minutes of physical activity most days can help lower blood pressure, lower cholesterol, and keep your weight at a healthy level.  Drug therapy is used when cholesterol levels do not respond to therapeutic lifestyle changes (smoking cessation, diet, and exercise) and remains unacceptably high.  If medication is started, it is important to have you levels checked periodically to evaluate the need for further treatment options.  Thank you,    [facility] Team

## 2010-10-05 NOTE — Consult Note (Signed)
Summary: [facility]  [facility]   Imported By: Lanelle Bal 06/25/2010 15:40:35  _____________________________________________________________________  External Attachment:    Type:   Image     Comment:   External Document

## 2010-10-05 NOTE — Letter (Signed)
Summary: Letter Regarding Lab Results/[facility]  Letter Regarding Lab Results/[facility]   Imported By: Lanelle Bal 06/23/2010 12:29:14  _____________________________________________________________________  External Attachment:    Type:   Image     Comment:   External Document

## 2010-10-05 NOTE — Letter (Signed)
Summary: [facility]   Imported By: Lanelle Bal 05/18/2010 10:34:56  _____________________________________________________________________  External Attachment:    Type:   Image     Comment:   External Document

## 2010-10-05 NOTE — Assessment & Plan Note (Signed)
Summary: [facility]   Visit Type:  Initial Consult Primary Provider:  Seymour Bars DO  CC:  Establish cardiology.  History of Present Illness: 70 year old male for evaluation of coronary disease. Patient has had stents placed previously in [place]. I do not have those records available. He now would prefer  to be followed here. Note he denies any orthopnea, PND, pedal edema, palpitations, syncope or chest pain. He does have some dyspnea on exertion relieved with rest. There is no associated symptoms. This is a chronic issue.  Current Medications (verified): 1)  Fluoxetine Hcl 20 Mg Caps (Fluoxetine Hcl) .... Take 2 Capsules in The Morning 2)  Atenolol 25 Mg Tabs (Atenolol) .Marland Kitchen.. 1 Tab By Mouth Daily 3)  Trazodone Hcl 100 Mg Tabs (Trazodone Hcl) .... Take 1 Tab By Mouth Once Daily 4)  Clonazepam 2 Mg Tabs (Clonazepam) .... .b   2 Tablets A Bedtime 5)  Zolpidem Tartrate 10 Mg Tabs (Zolpidem Tartrate) .... Take 1 Tab By Mouth At Bedtime 6)  Aspirin Ec 325 Mg Tbec (Aspirin) .... Take One Tablet By Mouth Daily 7)  Multivitamins  Tabs (Multiple Vitamin) 8)  Losartan Potassium 100 Mg Tabs (Losartan Potassium) .Marland Kitchen.. 1 Tab By Mouth Daily 9)  Creon 6000 Unit Cpep (Pancrelipase (Lip-Prot-Amyl)) .... Take 3 X A Day With Meals 10)  Amlodipine Besylate 5 Mg Tabs (Amlodipine Besylate) .... Take One Tablet By Mouth Daily 11)  Zenpep 20000 Unit Cpep (Pancrelipase (Lip-Prot-Amyl)) .... Take 2 Cap Before Meals 12)  Acetaminophen Pm 500-25 Mg Tabs (Diphenhydramine-Apap (Sleep)) .... As Needed 13)  Diovan 320 Mg Tabs (Valsartan) .... Take One Tablet By Mouth Daily 14)  Metoprolol Succinate 25 Mg Xr24h-Tab (Metoprolol Succinate) .... Take One Tablet By Mouth Daily  Allergies (verified): No Known Drug Allergies  Past History:  Past Medical History: HTN Hyperlipidemia Depression/ Anxiety CAD (stents placed in [facility] previously) shoulder DJD chronic pancreatitis asthma Lumbar  DDD bladder spasm IFG allergies hx of colon polyhps hx of PUD / GI bleed  Past Surgical History: Reviewed history from 02/25/2010 and no changes required. appy R shoulder replacement- Dr Marlyne Beards 08-2009 RTC surgery bilat Gall bladder  hiatal hernia repair 1991 ExLap in 1990s  Family History: Reviewed history from 02/02/2010 and no changes required. mother died of lung cancer at 49, high chol, HTN father died GSW sister HTN  Social History: Reviewed history from 02/02/2010 and no changes required. Quit smoking in 02-13-95. Has 6 kids, all grown. Was married x 2, 2nd wife died in February 13, 2008- suicide. Lives w/ girlfiend. Retired [employer]. Denies ETOH Exercises at the [employer].  Review of Systems       no fevers or chills, productive cough, hemoptysis, dysphasia, odynophagia, melena, hematochezia, dysuria, hematuria, rash, seizure activity, orthopnea, PND, pedal edema, claudication. Remaining systems are negative.   Vital Signs:  Patient profile:   70 year old male Height:      77 inches Weight:      237 pounds BMI:     28.21 Pulse rate:   46 / minute Pulse rhythm:   regular Resp:     18 per minute BP sitting:   116 / 66  (left arm) Cuff size:   large  Vitals Entered By: Vikki Ports (March 31, 2010 3:06 PM)  Physical Exam  General:  Well developed/well nourished in NAD Skin warm/dry Patient not depressed No peripheral clubbing Back-normal HEENT-normal/normal eyelids Neck supple/normal carotid upstroke bilaterally; no bruits; no JVD; no thyromegaly chest - CTA/ normal expansion CV -  Bradycardic but regular/normal S1 and S2; no murmurs, rubs or gallops; no rub; PMI nondisplaced Abdomen -NT/ND, no HSM, no mass, + bowel sounds, no bruit 2+ femoral pulses, no bruits Ext-no edema, chords, 2+ DP Neuro-grossly nonfocal     EKG  Procedure date:  03/31/2010  Findings:      Marked sinus bradycardia at a rate of 46. No ST changes. First degree AV block.  Impression &  Recommendations:  Problem # 1:  BRADYCARDIA (ICD-427.89)  Discontinue beta blockers. The following medications were removed from the medication list:    Atenolol 25 Mg Tabs (Atenolol) .Marland Kitchen... 1 tab by mouth daily    Plavix 75 Mg Tabs (Clopidogrel bisulfate) .Marland Kitchen... Take 1 tab by mouth once daily His updated medication list for this problem includes:    Aspirin Ec 325 Mg Tbec (Aspirin) .Marland Kitchen... Take one tablet by mouth daily    Amlodipine Besylate 5 Mg Tabs (Amlodipine besylate) .Marland Kitchen... Take one tablet by mouth daily    Atenolol 25 Mg Tabs (Atenolol) .Marland Kitchen... 1 once daily  Orders: EKG w/ Interpretation (93000)  Problem # 2:  HYPERLIPIDEMIA (ICD-272.4) Resume Pravachol 40 mg p.o. daily. Check lipids and liver in 6 weeks. The following medications were removed from the medication list:    Lipitor 40 Mg Tabs (Atorvastatin calcium) .Marland Kitchen... Take 1 tab by mouth at bedtime His updated medication list for this problem includes:    Pravastatin Sodium 40 Mg Tabs (Pravastatin sodium) .Marland Kitchen... 1 once daily  Problem # 3:  ESSENTIAL HYPERTENSION, BENIGN (ICD-401.1) Blood pressure controlled on present medications. Note the patient is known two ARBs. I will discontinue his Cozaar and continue Diovan. He will follow his blood pressure at home. If it increases we will add additional medication. The following medications were removed from the medication list:    Atenolol 25 Mg Tabs (Atenolol) .Marland Kitchen... 1 tab by mouth daily    Losartan Potassium 100 Mg Tabs (Losartan potassium) .Marland Kitchen... 1 tab by mouth daily His updated medication list for this problem includes:    Aspirin Ec 325 Mg Tbec (Aspirin) .Marland Kitchen... Take one tablet by mouth daily    Amlodipine Besylate 5 Mg Tabs (Amlodipine besylate) .Marland Kitchen... Take one tablet by mouth daily    Diovan 320 Mg Tabs (Valsartan) .Marland Kitchen... Take one tablet by mouth daily    Atenolol 25 Mg Tabs (Atenolol) .Marland Kitchen... 1 once daily  Problem # 4:  CAD (ICD-414.00) Continue aspirin, Plavix, ARB and resume statin.  I will request outside records concerning previous coronary disease, percutaneous interventions and most recent stress test. The following medications were removed from the medication list:    Atenolol 25 Mg Tabs (Atenolol) .Marland Kitchen... 1 tab by mouth daily    Plavix 75 Mg Tabs (Clopidogrel bisulfate) .Marland Kitchen... Take 1 tab by mouth once daily His updated medication list for this problem includes:    Aspirin Ec 325 Mg Tbec (Aspirin) .Marland Kitchen... Take one tablet by mouth daily    Amlodipine Besylate 5 Mg Tabs (Amlodipine besylate) .Marland Kitchen... Take one tablet by mouth daily    Atenolol 25 Mg Tabs (Atenolol) .Marland Kitchen... 1 once daily  Patient Instructions: 1)  Your physician recommends that you schedule a follow-up appointment in: 6 MONTH  WITH DR Jens Som Pollock OFFICE 2)  Your physician has recommended you make the following change in your medication: STOP LOSARTAN AND TOPROL 3)  START PRAVASTATIN 40 MG  1 once daily  4)  Your physician recommends that you return for lab work in: FASTING  LIPID LIVER 272. V58.69  Prescriptions: PRAVASTATIN SODIUM 40 MG TABS (PRAVASTATIN SODIUM) 1 once daily  #90 x 3   Entered by:   Scherrie Bateman, LPN   Authorized by:   Ferman Hamming, MD, [facility]   Signed by:   Scherrie Bateman, LPN on 16/06/9603   Method used:   Electronically to        [facility] [phone]* (retail)       [address] [place], [place]  [address]       Ph: [phone]       Fax: [phone]   RxID:   [id number]

## 2010-10-05 NOTE — Letter (Signed)
Summary: Letter to Patient with Path Results/[facility]  Letter to Patient with Path Results/[facility]   Imported By: Lanelle Bal 03/26/2010 13:36:46  _____________________________________________________________________  External Attachment:    Type:   Image     Comment:   External Document

## 2010-10-05 NOTE — Consult Note (Signed)
Summary: [facility]  [facility]   Imported By: Lanelle Bal 05/12/2010 08:59:18  _____________________________________________________________________  External Attachment:    Type:   Image     Comment:   External Document

## 2010-10-07 NOTE — Assessment & Plan Note (Signed)
Summary: [facility]   Visit Type:  Follow-up Primary Provider:  Seymour Bars DO  CC:  No complaints.  History of Present Illness: Pleasant male for followup of coronary disease. Patient has had stents placed previously in [place]. I do not have those records available. There is note in outside records that he had normal catheterization in July of 2010. Last seen here in July of 2011. Since then, the patient denies any dyspnea on exertion, orthopnea, PND, pedal edema, palpitations, syncope or chest pain.   Current Medications (verified): 1)  Fluoxetine Hcl 20 Mg Caps (Fluoxetine Hcl) .... Take 2 Capsules in The Morning 2)  Trazodone Hcl 100 Mg Tabs (Trazodone Hcl) .... Take 1 Tab By Mouth Once Daily 3)  Zolpidem Tartrate 10 Mg Tabs (Zolpidem Tartrate) .... Take 1 Tab By Mouth At Bedtime 4)  Aspirin Ec 325 Mg Tbec (Aspirin) .... Take One Tablet By Mouth Daily 5)  Multivitamins  Tabs (Multiple Vitamin) 6)  Amlodipine Besylate 5 Mg Tabs (Amlodipine Besylate) .... Take One Tablet By Mouth Daily 7)  Zenpep 20000 Unit Cpep (Pancrelipase (Lip-Prot-Amyl)) .... Take 2 Cap Before Meals 8)  Acetaminophen Pm 500-25 Mg Tabs (Diphenhydramine-Apap (Sleep)) .... As Needed 9)  Diovan 320 Mg Tabs (Valsartan) .... Take One Tablet By Mouth Daily 10)  Pravastatin Sodium 80 Mg Tabs (Pravastatin Sodium) .Marland Kitchen.. 1 Once Daily 11)  Dicyclomine Hcl 20 Mg Tabs (Dicyclomine Hcl) .... Take 1 Tablet By Mouth Once A Day 12)  Alprazolam 0.5 Mg Tabs (Alprazolam) .... Take 1 Tablet By Mouth Two Times A Day  Allergies (verified): No Known Drug Allergies  Past History:  Past Medical History: Reviewed history from 06/07/2010 and no changes required. HTN Hyperlipidemia Depression/ Anxiety CAD (stents placed in [facility] previously) shoulder DJD chronic pancreatitis asthma Lumbar DDD bladder spasm IFG allergies hx of colon polyhps hx of PUD / GI bleed  urology: in [place] (sees for DRE and PSA  annually). Dr Jens Som cards GI Dr Rhetta Mura  Past Surgical History: Reviewed history from 02/25/2010 and no changes required. appy R shoulder replacement- Dr Marlyne Beards 08-2009 RTC surgery bilat Gall bladder  hiatal hernia repair 1991 ExLap in 1990s  Social History: Reviewed history from 02/02/2010 and no changes required. Quit smoking in 01/28/1995. Has 6 kids, all grown. Was married x 2, 2nd wife died in 2008/01/28- suicide. Lives w/ girlfiend. Retired [employer]. Denies ETOH Exercises at [employer].  Review of Systems       no fevers or chills, productive cough, hemoptysis, dysphasia, odynophagia, melena, hematochezia, dysuria, hematuria, rash, seizure activity, orthopnea, PND, pedal edema, claudication. Remaining systems are negative.   Vital Signs:  Patient profile:   70 year old male Height:      77 inches Weight:      253 pounds BMI:     30.11 Pulse rate:   67 / minute Pulse rhythm:   regular Resp:     18 per minute BP sitting:   122 / 77  (right arm) Cuff size:   large  Vitals Entered By: Vikki Ports (September 15, 2010 3:00 PM)  Physical Exam  General:  Well-developed well-nourished in no acute distress.  Skin is warm and dry.  HEENT is normal.  Neck is supple. No thyromegaly.  Chest is clear to auscultation with normal expansion.  Cardiovascular exam is regular rate and rhythm.  Abdominal exam nontender or distended. No masses palpated. Extremities LUE in sling from recent rotator cuff surgery neuro grossly intact    EKG  Procedure date:  09/15/2010  Findings:      Sinus rhythm at a rate of 61. No significant ST changes.  Impression & Recommendations:  Problem # 1:  CAD (ICD-414.00) Continue aspirin and statin. The following medications were removed from the medication list:    Atenolol 25 Mg Tabs (Atenolol) .Marland Kitchen... 1 once daily His updated medication list for this problem includes:    Aspirin Ec 325 Mg Tbec (Aspirin) .Marland Kitchen... Take one tablet by mouth daily     Amlodipine Besylate 5 Mg Tabs (Amlodipine besylate) .Marland Kitchen... Take one tablet by mouth daily    Lisinopril 40 Mg Tabs (Lisinopril) .Marland Kitchen... Take one tablet by mouth daily  Problem # 2:  ESSENTIAL HYPERTENSION, BENIGN (ICD-401.1) Blood pressure controlled on present medications. He would like to change Diovan secondary to cost. I will discontinue this medication when he completes his present prescription. We will then begin lisinopril milligrams p.o. daily. Check potassium and renal function one week after beginning lisinopril. The following medications were removed from the medication list:    Atenolol 25 Mg Tabs (Atenolol) .Marland Kitchen... 1 once daily His updated medication list for this problem includes:    Aspirin Ec 325 Mg Tbec (Aspirin) .Marland Kitchen... Take one tablet by mouth daily    Amlodipine Besylate 5 Mg Tabs (Amlodipine besylate) .Marland Kitchen... Take one tablet by mouth daily    Lisinopril 40 Mg Tabs (Lisinopril) .Marland Kitchen... Take one tablet by mouth daily  Problem # 3:  HYPERLIPIDEMIA (ICD-272.4) Continue statin. Lipids and liver monitored by primary care. His updated medication list for this problem includes:    Pravastatin Sodium 80 Mg Tabs (Pravastatin sodium) .Marland Kitchen... 1 once daily  Patient Instructions: 1)  Your physician has recommended you make the following change in your medication: WHEN FINISHED WITH DIOVAN-START LISINOPRIL 40MG  ONCE DAILY 2)  Your physician wants you to follow-up in: ONE YEAR  You will receive a reminder letter in the mail two months in advance. If you don't receive a letter, please call our office to schedule the follow-up appointment. Prescriptions: LISINOPRIL 40 MG TABS (LISINOPRIL) Take one tablet by mouth daily  #90 x 4   Entered by:   Deliah Goody, RN   Authorized by:   Ferman Hamming, MD, [facility]   Signed by:   Deliah Goody, RN on 09/15/2010   Method used:   Electronically to        [facility] [phone]* (retail)       [address] [place], [place]  [address]       Ph:  [phone]       Fax: [phone]   RxID:   [id number]

## 2010-10-08 ENCOUNTER — Encounter: Payer: Self-pay | Admitting: Family Medicine

## 2010-10-12 ENCOUNTER — Encounter: Payer: Self-pay | Admitting: Family Medicine

## 2010-10-21 ENCOUNTER — Encounter: Payer: Self-pay | Admitting: Family Medicine

## 2010-10-27 ENCOUNTER — Encounter: Payer: Self-pay | Admitting: Family Medicine

## 2010-10-27 ENCOUNTER — Encounter (INDEPENDENT_AMBULATORY_CARE_PROVIDER_SITE_OTHER): Payer: Medicare PPO | Admitting: Family Medicine

## 2010-10-27 DIAGNOSIS — Z0000 Encounter for general adult medical examination without abnormal findings: Secondary | ICD-10-CM

## 2010-10-27 NOTE — Letter (Signed)
Summary: [facility]   [facility]   Imported By: Kassie Mends 10/21/2010 08:28:08  _____________________________________________________________________  External Attachment:    Type:   Image     Comment:   External Document

## 2010-10-28 ENCOUNTER — Ambulatory Visit
Admission: RE | Admit: 2010-10-28 | Discharge: 2010-10-28 | Disposition: A | Discharge status: Home / Self Care | Payer: Medicare PPO | Source: Ambulatory Visit | Attending: Gastroenterology | Admitting: Gastroenterology

## 2010-10-28 ENCOUNTER — Encounter: Payer: Self-pay | Admitting: Family Medicine

## 2010-10-28 ENCOUNTER — Other Ambulatory Visit: Payer: Self-pay | Admitting: Gastroenterology

## 2010-10-28 DIAGNOSIS — K859 Acute pancreatitis without necrosis or infection, unspecified: Secondary | ICD-10-CM

## 2010-10-29 ENCOUNTER — Encounter: Payer: Self-pay | Admitting: Family Medicine

## 2010-11-02 NOTE — Assessment & Plan Note (Signed)
Summary: CPE   Vital Signs:  Patient profile:   70 year old male Height:      77 inches Weight:      239 pounds BMI:     28.44 O2 Sat:      96 % on Room air Pulse rate:   64 / minute BP sitting:   106 / 75  (left arm) Cuff size:   large  Vitals Entered By: Payton Spark CMA (October 27, 2010 12:58 PM)  O2 Flow:  Room air CC: CPE   Primary Care Provider:  Seymour Bars DO  CC:  CPE.  History of Present Illness: 70 yo WM with hx of chronic pancreatitis s/p recent 14 day hospitalization for chronic pancreatitis the end of Jan presents for CPE.  He is doing better but has lost weight given recent events.  He has not been able to eat in the past 3-4 wks.  He has just started to advance to a soft mechanical diet.  Abd pain has improved but he is needing to take Oxycontin per Dr Rhetta Mura.  he has f/u with them tomorrow and sees Dr Jens Som for hx of CAD.  Denies CP or DOE.  He is trying to stay hydrated.  Denies N/V/D.  Denies constipation from medicine.  He is drinking 2 cans of ensure each day.    He Pneumovax was updated 2 yrs ago and his tetanus is UTD.     Current Medications (verified): 1)  Fluoxetine Hcl 20 Mg Caps (Fluoxetine Hcl) .... Take 2 Capsules in The Morning 2)  Trazodone Hcl 100 Mg Tabs (Trazodone Hcl) .... Take 1 Tab By Mouth Once Daily 3)  Zolpidem Tartrate 10 Mg Tabs (Zolpidem Tartrate) .... Take 1 Tab By Mouth At Bedtime 4)  Aspirin Ec 325 Mg Tbec (Aspirin) .... Take One Tablet By Mouth Daily 5)  Multivitamins  Tabs (Multiple Vitamin) 6)  Amlodipine Besylate 5 Mg Tabs (Amlodipine Besylate) .... Take One Tablet By Mouth Daily 7)  Zenpep 20000 Unit Cpep (Pancrelipase (Lip-Prot-Amyl)) .... Take 2 Cap Before Meals 8)  Acetaminophen Pm 500-25 Mg Tabs (Diphenhydramine-Apap (Sleep)) .... As Needed 9)  Lisinopril 40 Mg Tabs (Lisinopril) .... Take One Tablet By Mouth Daily 10)  Pravastatin Sodium 80 Mg Tabs (Pravastatin Sodium) .Marland Kitchen.. 1 Once Daily 11)  Dicyclomine Hcl 20 Mg  Tabs (Dicyclomine Hcl) .... Take 1 Tablet By Mouth Once A Day 12)  Alprazolam 0.5 Mg Tabs (Alprazolam) .... Take 1 Tablet By Mouth Two Times A Day  Allergies (verified): No Known Drug Allergies  Past History:  Past Medical History: Reviewed history from 06/07/2010 and no changes required. HTN Hyperlipidemia Depression/ Anxiety CAD (stents placed in [facility] previously) shoulder DJD chronic pancreatitis asthma Lumbar DDD bladder spasm IFG allergies hx of colon polyhps hx of PUD / GI bleed  urology: in [place] (sees for DRE and PSA annually). Dr Jens Som cards GI Dr Rhetta Mura  Past Surgical History: Reviewed history from 02/25/2010 and no changes required. appy R shoulder replacement- Dr Marlyne Beards 08-2009 RTC surgery bilat Gall bladder  hiatal hernia repair 1991 ExLap in 1990s  Family History: Reviewed history from 02/02/2010 and no changes required. mother died of lung cancer at 53, high chol, HTN father died GSW sister HTN  Social History: Reviewed history from 02/02/2010 and no changes required. Quit smoking in 01/21/1995. Has 6 kids, all grown. Was married x 2, 2nd wife died in 01/21/2008- suicide. Lives w/ girlfiend. Retired [employer]. Denies ETOH Exercises at the [employer].  Review  of Systems       The patient complains of anorexia and abdominal pain.  The patient denies fever, weight loss, weight gain, vision loss, decreased hearing, hoarseness, chest pain, syncope, dyspnea on exertion, peripheral edema, prolonged cough, headaches, hemoptysis, melena, hematochezia, severe indigestion/heartburn, hematuria, incontinence, genital sores, muscle weakness, suspicious skin lesions, transient blindness, difficulty walking, depression, unusual weight change, abnormal bleeding, enlarged lymph nodes, angioedema, breast masses, and testicular masses.    Physical Exam  General:  alert, well-developed, well-nourished, and well-hydrated.   Head:  normocephalic and atraumatic.   Eyes:   sclera non icteric Ears:  no external deformities.   Nose:  no nasal discharge.   Mouth:  pharynx pink and moist and fair dentition.   Neck:  no masses.  no audible carotid bruits Lungs:  Normal respiratory effort, chest expands symmetrically. Lungs are clear to auscultation, no crackles or wheezes. Heart:  Normal rate and regular rhythm. S1 and S2 normal without gallop, murmur, click, rub or other extra sounds. Abdomen:  soft, non-tender, normal bowel sounds, no distention, no masses, no guarding, no hepatomegaly, and no splenomegaly.   Pulses:  2+ radial and pedal pulses Extremities:  no LE edema Neurologic:  gait normal.   Skin:  color normal.   Cervical Nodes:  No lymphadenopathy noted Psych:  good eye contact, not anxious appearing, and not depressed appearing.     Impression & Recommendations:  Problem # 1:  HEALTH MAINTENANCE EXAM (ICD-V70.0) Keeping healthy checklist for men reviewed. BP at goal.  BMI 28= overwt. Update fasting labs. DRE and PSA today for prostate cancer screening. Colonoscopy normal 03-2010. Sees Dr Jens Som for CAD/ cardiac screening. Immunizations UTD. Continue current meds. f/u in 6 mos. GI f/u tomorrow for chronic pancreatitis.  Complete Medication List: 1)  Fluoxetine Hcl 20 Mg Caps (Fluoxetine hcl) .... Take 2 capsules in the morning 2)  Trazodone Hcl 100 Mg Tabs (Trazodone hcl) .... Take 1 tab by mouth once daily 3)  Zolpidem Tartrate 10 Mg Tabs (Zolpidem tartrate) .... Take 1 tab by mouth at bedtime 4)  Aspirin Ec 325 Mg Tbec (Aspirin) .... Take one tablet by mouth daily 5)  Multivitamins Tabs (Multiple vitamin) 6)  Amlodipine Besylate 5 Mg Tabs (Amlodipine besylate) .... Take one tablet by mouth daily 7)  Zenpep 20000 Unit Cpep (Pancrelipase (lip-prot-amyl)) .... Take 2 cap before meals 8)  Acetaminophen Pm 500-25 Mg Tabs (Diphenhydramine-apap (sleep)) .... As needed 9)  Lisinopril 40 Mg Tabs (Lisinopril) .... Take one tablet by mouth  daily 10)  Pravastatin Sodium 80 Mg Tabs (Pravastatin sodium) .Marland Kitchen.. 1 once daily 11)  Dicyclomine Hcl 20 Mg Tabs (Dicyclomine hcl) .... Take 1 tablet by mouth once a day 12)  Alprazolam 0.5 Mg Tabs (Alprazolam) .... Take 1 tablet by mouth two times a day  Other Orders: T-PSA Total ([address]) T-Comprehensive Metabolic Panel [phone]) T-Lipid Profile ([id number]) T-CBC w/Diff ([id number])  Patient Instructions: 1)  Update labs today. 2)  Will call you w/ results tomorrow. 3)  f/u with Dr Rhetta Mura for chronic pancreatitis. 4)  Use CETAPHIL cream daily after bathing for dry skin. 5)  Continue current meds. 6)  Have pharmacy fax any RF requests. 7)  Return for f/u in 6 mos. Prescriptions: PRAVASTATIN SODIUM 80 MG TABS (PRAVASTATIN SODIUM) 1 once daily  #30 x 11   Entered and Authorized by:   Seymour Bars DO   Signed by:   Seymour Bars DO on 10/27/2010   Method used:   Electronically to  [address] [phone]* (retail)       [address] [place], [place]  [address]       Ph: [phone]       Fax: [phone]   RxID:   [id number]    Orders Added: 1)  T-PSA Total [[id number]] 2)  T-Comprehensive Metabolic Panel [[id number]] 3)  T-Lipid Profile [[id number]] 4)  T-CBC w/Diff [[id number]] 5)  Est. Patient age 34&> [20254]   Immunization History:  Tetanus/Td Immunization History:    Tetanus/Td:  historical (09/05/2004)   Immunization History:  Tetanus/Td Immunization History:    Tetanus/Td:  Historical (09/05/2004)

## 2010-11-02 NOTE — Procedures (Signed)
Summary: Endoscopic US/[facility]  Endoscopic US/[facility]   Imported By: Lanelle Bal 10/27/2010 13:34:34  _____________________________________________________________________  External Attachment:    Type:   Image     Comment:   External Document

## 2010-11-02 NOTE — Op Note (Signed)
Summary: Cholangiopancreatography/[facility]  Cholangiopancreatography/[facility]   Imported By: Lanelle Bal 10/27/2010 13:36:01  _____________________________________________________________________  External Attachment:    Type:   Image     Comment:   External Document

## 2010-11-11 NOTE — Letter (Signed)
Summary: [facility] addendum  [facility] addendum   Imported By: Kassie Mends 11/04/2010 08:43:51  _____________________________________________________________________  External Attachment:    Type:   Image     Comment:   External Document

## 2010-11-17 LAB — CONVERTED CEMR LAB
ALT: 35 units/L (ref 0–53)
Albumin: 4.8 g/dL (ref 3.5–5.2)
Calcium: 10 mg/dL (ref 8.4–10.5)
Cholesterol: 105 mg/dL (ref 0–200)
HCT: 44.9 % (ref 39.0–52.0)
HDL: 30 mg/dL — ABNORMAL LOW (ref >39)
LDL Cholesterol: 50 mg/dL (ref 0–99)
Lymphocytes Relative: 35 % (ref 12–46)
Lymphs Abs: 1.9 10*3/uL (ref 0.7–4.0)
MCHC: 33.2 g/dL (ref 30.0–36.0)
Monocytes Absolute: 0.4 10*3/uL (ref 0.1–1.0)
PSA, Free Pct: 41 (ref >25)
Platelets: 207 10*3/uL (ref 150–400)
RDW: 13.1 % (ref 11.5–15.5)
VLDL: 25 mg/dL (ref 0–40)
WBC: 5.4 10*3/uL (ref 4.0–10.5)

## 2010-11-21 LAB — CBC
HCT: 40.5 % (ref 39.0–52.0)
HCT: 38.6 % — ABNORMAL LOW (ref 39.0–52.0)
Hemoglobin: 13.9 g/dL (ref 13.0–17.0)
Hemoglobin: 13.2 g/dL (ref 13.0–17.0)
MCH: 31.3 pg (ref 26.0–34.0)
MCHC: 34.3 g/dL (ref 30.0–36.0)
MCV: 91.3 fL (ref 78.0–100.0)
MCV: 91.3 fL (ref 78.0–100.0)
Platelets: 133 10*3/uL — ABNORMAL LOW (ref 150–400)
RBC: 4.44 MIL/uL (ref 4.22–5.81)
WBC: 4.2 10*3/uL (ref 4.0–10.5)
WBC: 3.8 10*3/uL — ABNORMAL LOW (ref 4.0–10.5)

## 2010-11-21 LAB — COMPREHENSIVE METABOLIC PANEL
ALT: 24 U/L (ref 0–53)
ALT: 73 U/L — ABNORMAL HIGH (ref 0–53)
Alkaline Phosphatase: 79 U/L (ref 39–117)
Alkaline Phosphatase: 124 U/L — ABNORMAL HIGH (ref 39–117)
Alkaline Phosphatase: 128 U/L — ABNORMAL HIGH (ref 39–117)
BUN: 2 mg/dL — ABNORMAL LOW (ref 6–23)
Calcium: 8.7 mg/dL (ref 8.4–10.5)
Chloride: 105 mEq/L (ref 96–112)
Creatinine, Ser: 1.27 mg/dL (ref 0.4–1.5)
Creatinine, Ser: 0.98 mg/dL (ref 0.4–1.5)
GFR calc Af Amer: >60 mL/min (ref >60)
GFR calc Af Amer: >60 mL/min (ref >60)
GFR calc non Af Amer: 57 mL/min — ABNORMAL LOW (ref >60)
GFR calc non Af Amer: >60 mL/min (ref >60)
Glucose, Bld: 104 mg/dL — ABNORMAL HIGH (ref 70–99)
Potassium: 4.5 mEq/L (ref 3.5–5.1)
Sodium: 141 mEq/L (ref 135–145)
Total Bilirubin: 0.8 mg/dL (ref 0.3–1.2)
Total Protein: 7 g/dL (ref 6.0–8.3)
Total Protein: 6.1 g/dL (ref 6.0–8.3)

## 2010-11-21 LAB — DIFFERENTIAL
Basophils Absolute: 0 10*3/uL (ref 0.0–0.1)
Eosinophils Absolute: 0.1 10*3/uL (ref 0.0–0.7)
Monocytes Absolute: 0.4 10*3/uL (ref 0.1–1.0)

## 2010-11-22 LAB — COMPREHENSIVE METABOLIC PANEL
ALT: 112 U/L — ABNORMAL HIGH (ref 0–53)
AST: 186 U/L — ABNORMAL HIGH (ref 0–37)
Albumin: 3.8 g/dL (ref 3.5–5.2)
Albumin: 4.3 g/dL (ref 3.5–5.2)
Alkaline Phosphatase: 130 U/L — ABNORMAL HIGH (ref 39–117)
Alkaline Phosphatase: 92 U/L (ref 39–117)
BUN: 15 mg/dL (ref 6–23)
Calcium: 8.3 mg/dL — ABNORMAL LOW (ref 8.4–10.5)
Chloride: 105 mEq/L (ref 96–112)
Creatinine, Ser: 0.91 mg/dL (ref 0.4–1.5)
Creatinine, Ser: 0.95 mg/dL (ref 0.4–1.5)
GFR calc Af Amer: >60 mL/min (ref >60)
GFR calc Af Amer: >60 mL/min (ref >60)
GFR calc Af Amer: >60 mL/min (ref >60)
Glucose, Bld: 90 mg/dL (ref 70–99)
Potassium: 3.8 mEq/L (ref 3.5–5.1)
Potassium: 4.4 mEq/L (ref 3.5–5.1)
Sodium: 141 mEq/L (ref 135–145)
Total Bilirubin: 1.6 mg/dL — ABNORMAL HIGH (ref 0.3–1.2)
Total Protein: 6.2 g/dL (ref 6.0–8.3)
Total Protein: 7.3 g/dL (ref 6.0–8.3)

## 2010-11-22 LAB — DIFFERENTIAL
Lymphocytes Relative: 10 % — ABNORMAL LOW (ref 12–46)
Monocytes Absolute: 0.5 10*3/uL (ref 0.1–1.0)
Monocytes Relative: 5 % (ref 3–12)
Neutro Abs: 7.4 10*3/uL (ref 1.7–7.7)

## 2010-11-22 LAB — URINALYSIS, ROUTINE W REFLEX MICROSCOPIC
Glucose, UA: NEGATIVE mg/dL
Ketones, ur: NEGATIVE mg/dL
Urobilinogen, UA: 1 mg/dL (ref 0.0–1.0)
pH: 5.5 (ref 5.0–8.0)

## 2010-11-22 LAB — LIPID PANEL
Cholesterol: 79 mg/dL (ref 0–200)
HDL: 42 mg/dL (ref >39)
Total CHOL/HDL Ratio: 1.9 RATIO

## 2010-11-22 LAB — HEMOCCULT GUIAC POC 1CARD (OFFICE): Fecal Occult Bld: NEGATIVE

## 2010-11-22 LAB — POCT CARDIAC MARKERS
CKMB, poc: 1.6 ng/mL (ref 1.0–8.0)
Myoglobin, poc: 65 ng/mL (ref 12–200)

## 2010-11-22 LAB — CBC
HCT: 43.1 % (ref 39.0–52.0)
Platelets: 150 10*3/uL (ref 150–400)
Platelets: 179 10*3/uL (ref 150–400)
RDW: 15.1 % (ref 11.5–15.5)
WBC: 4.5 10*3/uL (ref 4.0–10.5)

## 2010-11-22 LAB — LIPASE, BLOOD: Lipase: 162 U/L — ABNORMAL HIGH (ref 11–59)

## 2010-11-23 NOTE — Letter (Signed)
Summary: Imaging Results Letter/[facility]  Imaging Results Letter/[facility]   Imported By: Maryln Gottron 11/16/2010 09:58:50  _____________________________________________________________________  External Attachment:    Type:   Image     Comment:   External Document

## 2010-11-23 NOTE — Consult Note (Signed)
Summary: [facility]  [facility]   Imported By: Maryln Gottron 11/16/2010 10:19:35  _____________________________________________________________________  External Attachment:    Type:   Image     Comment:   External Document

## 2010-12-24 ENCOUNTER — Other Ambulatory Visit: Payer: Self-pay | Admitting: Family Medicine

## 2011-01-23 ENCOUNTER — Encounter: Payer: Self-pay | Admitting: Family Medicine

## 2011-01-23 ENCOUNTER — Inpatient Hospital Stay (INDEPENDENT_AMBULATORY_CARE_PROVIDER_SITE_OTHER)
Admission: RE | Admit: 2011-01-23 | Discharge: 2011-01-23 | Disposition: A | Discharge status: Home / Self Care | Payer: Medicare PPO | Source: Ambulatory Visit | Attending: Family Medicine | Admitting: Family Medicine

## 2011-01-23 DIAGNOSIS — R109 Unspecified abdominal pain: Secondary | ICD-10-CM

## 2011-01-23 DIAGNOSIS — K861 Other chronic pancreatitis: Secondary | ICD-10-CM

## 2011-01-23 LAB — CONVERTED CEMR LAB
Bilirubin Urine: NEGATIVE
Glucose, Urine, Semiquant: NEGATIVE
Nitrite: NEGATIVE

## 2011-01-27 ENCOUNTER — Telehealth (INDEPENDENT_AMBULATORY_CARE_PROVIDER_SITE_OTHER): Payer: Self-pay | Admitting: Emergency Medicine

## 2011-02-10 ENCOUNTER — Other Ambulatory Visit: Payer: Self-pay | Admitting: *Deleted

## 2011-02-10 MED ORDER — AMLODIPINE BESYLATE 5 MG PO TABS: 5.0000 mg | ORAL_TABLET | Freq: Every day | ORAL | Status: DC

## 2011-04-09 ENCOUNTER — Inpatient Hospital Stay (INDEPENDENT_AMBULATORY_CARE_PROVIDER_SITE_OTHER)
Admission: RE | Admit: 2011-04-09 | Discharge: 2011-04-09 | Disposition: A | Discharge status: Home / Self Care | Payer: Medicare PPO | Source: Ambulatory Visit | Attending: Emergency Medicine | Admitting: Emergency Medicine

## 2011-04-09 ENCOUNTER — Encounter: Payer: Self-pay | Admitting: Emergency Medicine

## 2011-04-09 ENCOUNTER — Ambulatory Visit
Admission: RE | Admit: 2011-04-09 | Discharge: 2011-04-09 | Disposition: A | Discharge status: Home / Self Care | Payer: Medicare PPO | Source: Ambulatory Visit | Attending: Emergency Medicine | Admitting: Emergency Medicine

## 2011-04-09 ENCOUNTER — Other Ambulatory Visit: Payer: Self-pay | Admitting: Emergency Medicine

## 2011-04-09 DIAGNOSIS — M25579 Pain in unspecified ankle and joints of unspecified foot: Secondary | ICD-10-CM

## 2011-04-12 ENCOUNTER — Telehealth (INDEPENDENT_AMBULATORY_CARE_PROVIDER_SITE_OTHER): Payer: Self-pay | Admitting: *Deleted

## 2011-04-19 ENCOUNTER — Encounter: Payer: Self-pay | Admitting: Cardiology

## 2011-04-20 ENCOUNTER — Encounter: Payer: Self-pay | Admitting: Family Medicine

## 2011-04-27 ENCOUNTER — Ambulatory Visit: Payer: Medicare PPO | Admitting: Family Medicine

## 2011-04-27 ENCOUNTER — Other Ambulatory Visit: Payer: Self-pay | Admitting: *Deleted

## 2011-04-27 MED ORDER — DICYCLOMINE HCL 20 MG PO TABS: 20.0000 mg | ORAL_TABLET | Freq: Every day | ORAL | Status: DC

## 2011-04-27 MED ORDER — AMLODIPINE BESYLATE 5 MG PO TABS: 5.0000 mg | ORAL_TABLET | Freq: Every day | ORAL | Status: DC

## 2011-04-27 MED ORDER — PANCRELIPASE (LIP-PROT-AMYL) 20000-68000 UNITS PO CPEP: 2.0000 | ORAL_CAPSULE | Freq: Three times a day (TID) | ORAL | Status: DC

## 2011-04-27 MED ORDER — PRAVASTATIN SODIUM 80 MG PO TABS: 80.0000 mg | ORAL_TABLET | Freq: Every day | ORAL | Status: DC

## 2011-04-27 MED ORDER — LISINOPRIL 40 MG PO TABS: 40.0000 mg | ORAL_TABLET | Freq: Every day | ORAL | Status: DC

## 2011-04-27 MED ORDER — ZOLPIDEM TARTRATE 10 MG PO TABS: 10.0000 mg | ORAL_TABLET | Freq: Every day | ORAL | Status: DC

## 2011-05-31 ENCOUNTER — Encounter: Payer: Self-pay | Admitting: Family Medicine

## 2011-05-31 ENCOUNTER — Ambulatory Visit (INDEPENDENT_AMBULATORY_CARE_PROVIDER_SITE_OTHER): Payer: Medicare PPO | Admitting: Family Medicine

## 2011-05-31 VITALS — BP 120/75 | HR 54 | Ht 73.0 in | Wt 235.0 lb

## 2011-05-31 DIAGNOSIS — Z23 Encounter for immunization: Secondary | ICD-10-CM

## 2011-05-31 DIAGNOSIS — K8689 Other specified diseases of pancreas: Secondary | ICD-10-CM

## 2011-05-31 DIAGNOSIS — G479 Sleep disorder, unspecified: Secondary | ICD-10-CM

## 2011-05-31 DIAGNOSIS — I10 Essential (primary) hypertension: Secondary | ICD-10-CM

## 2011-05-31 DIAGNOSIS — E785 Hyperlipidemia, unspecified: Secondary | ICD-10-CM

## 2011-05-31 DIAGNOSIS — R609 Edema, unspecified: Secondary | ICD-10-CM

## 2011-05-31 DIAGNOSIS — S20219A Contusion of unspecified front wall of thorax, initial encounter: Secondary | ICD-10-CM

## 2011-05-31 DIAGNOSIS — R600 Localized edema: Secondary | ICD-10-CM

## 2011-05-31 DIAGNOSIS — K861 Other chronic pancreatitis: Secondary | ICD-10-CM

## 2011-05-31 DIAGNOSIS — F329 Major depressive disorder, single episode, unspecified: Secondary | ICD-10-CM

## 2011-05-31 DIAGNOSIS — F32A Depression, unspecified: Secondary | ICD-10-CM

## 2011-05-31 MED ORDER — TRAZODONE HCL 100 MG PO TABS: 100.0000 mg | ORAL_TABLET | Freq: Every day | ORAL | Status: DC

## 2011-05-31 MED ORDER — PANCRELIPASE (LIP-PROT-AMYL) 20000-68000 UNITS PO CPEP: 2.0000 | ORAL_CAPSULE | Freq: Three times a day (TID) | ORAL | Status: DC

## 2011-05-31 MED ORDER — PRAVASTATIN SODIUM 80 MG PO TABS: 80.0000 mg | ORAL_TABLET | Freq: Every day | ORAL | Status: DC

## 2011-05-31 MED ORDER — ALPRAZOLAM 0.5 MG PO TABS: 0.5000 mg | ORAL_TABLET | Freq: Every evening | ORAL | Status: DC | PRN

## 2011-05-31 MED ORDER — ZOLPIDEM TARTRATE 10 MG PO TABS
10.0000 mg | ORAL_TABLET | Freq: Every day | ORAL | Status: DC
Start: 2011-05-31 — End: 2013-04-15

## 2011-05-31 MED ORDER — AMLODIPINE BESYLATE 5 MG PO TABS: 5.0000 mg | ORAL_TABLET | Freq: Every day | ORAL | Status: DC

## 2011-05-31 MED ORDER — FLUOXETINE HCL 20 MG PO CAPS: 40.0000 mg | ORAL_CAPSULE | ORAL | Status: DC

## 2011-05-31 MED ORDER — LISINOPRIL 40 MG PO TABS: 40.0000 mg | ORAL_TABLET | Freq: Every day | ORAL | Status: DC

## 2011-05-31 MED ORDER — DICYCLOMINE HCL 20 MG PO TABS: 20.0000 mg | ORAL_TABLET | Freq: Every day | ORAL | Status: DC

## 2011-05-31 MED ORDER — FUROSEMIDE 20 MG PO TABS: 20.0000 mg | ORAL_TABLET | Freq: Every day | ORAL | Status: DC | PRN

## 2011-05-31 NOTE — Progress Notes (Signed)
  Subjective:    Patient ID: Miguel Glenn, male    DOB: 07/22/1941, 70 y.o.   MRN: [id number]  HPI Patient w/ multiple new complaints as well as old problems to folow. He doesneed his flu vaccine given.    Review of Systems  Constitutional: Positive for activity change.  Cardiovascular: Positive for leg swelling.       Hypertension and hyperlipidemia.  He also has been having occasional swelling of his legs . He has used lasix for that before.  Gastrointestinal:       HX of acute and chronic pancretitis causing pancreatic insufficiency  Musculoskeletal:       Contusion of R ribs last wek and now pain w/ deep breaths             Skin: Negative.  Negative for color change.  Psychiatric/Behavioral: Positive for sleep disturbance, dysphoric mood and agitation.       He has sleep disorder and mood disorder both have gotten better since he started driving again . He states he does dot uses his xanax when he drives but he needs his medication renewed.       Objective:   Physical Exam  Constitutional: He is oriented to person, place, and time. He appears well-developed and well-nourished.  HENT:  Head: Normocephalic and atraumatic.  Neck: Normal range of motion. Neck supple.  Cardiovascular: Normal rate, regular rhythm and normal heart sounds.   Pulmonary/Chest: Effort normal and breath sounds normal.       Tenderness over the R ribs  Musculoskeletal: Normal range of motion. He exhibits edema.       Trace to 1+ edema lower extremities  Neurological: He is alert and oriented to person, place, and time.  Skin: Skin is warm.  Psychiatric: His behavior is normal.          Assessment & Plan:  Hyperlipedemia renew pravachol Hypertension  Maintain lisinopril and norvasc Pancreatic insufficiency maintain Bentyl and pancrelipase.  For depression and sleep disorder xanax,ambien, trazadone and prozac renewed Flu vaxccine given Rib contusion X-ray offered decline continue to observe  return if worse Recurrent edema lasix restarted to use prn daily when needed Rib contusion if worse return. 6 months for PE Lasix  qday for swelling prn Continue current medication regimen we have discussed. Rib contusion if worse return. 6 months for PE Lasix  qday for swelling prn Continue current medication regimen we have discussed.

## 2011-05-31 NOTE — Patient Instructions (Signed)
Rib contusion if worse return. 6 months for PE Lasix  qday for swelling prn Continue current medication regimen we have discussed.

## 2011-07-21 ENCOUNTER — Other Ambulatory Visit: Payer: Self-pay | Admitting: *Deleted

## 2011-07-21 DIAGNOSIS — E785 Hyperlipidemia, unspecified: Secondary | ICD-10-CM

## 2011-07-21 NOTE — Telephone Encounter (Signed)
error 

## 2011-08-08 NOTE — Telephone Encounter (Signed)
  Phone Note Outgoing Call Call back at [facility] Phone [phone]   Call placed by: Emilio Math,  Jan 27, 2011 2:03 PM Call placed to: Patient Summary of Call: Patient is doing well

## 2011-08-08 NOTE — Progress Notes (Signed)
Summary: edema and pain left foot/ankle/lower leg   Vital Signs:  Patient Profile:   70 Years Old Male CC:      edema and discomfort left foot/ankle/calf x 2 weeks  Height:     77 inches Weight:      247 pounds O2 Sat:      94 % O2 treatment:    Room Air Temp:     97.8 degrees F oral Pulse rate:   53 / minute Resp:     16 per minute BP sitting:   142 / 80  (left arm) Cuff size:   large  Pt. in pain?   yes    Location:   left foot/ankle/lower leg  Vitals Entered By: Lavell Islam RN (April 09, 2011 11:51 AM)                   Updated Prior Medication List: FLUOXETINE HCL 20 MG CAPS (FLUOXETINE HCL) take 2 capsules in the morning TRAZODONE HCL 100 MG TABS (TRAZODONE HCL) Take 1 tab by mouth once daily ZOLPIDEM TARTRATE 10 MG TABS (ZOLPIDEM TARTRATE) Take 1 tab by mouth at bedtime ASPIRIN EC 325 MG TBEC (ASPIRIN) Take one tablet by mouth daily MULTIVITAMINS  TABS (MULTIPLE VITAMIN)  AMLODIPINE BESYLATE 5 MG TABS (AMLODIPINE BESYLATE) Take one tablet by mouth daily ZENPEP 20000 UNIT CPEP (PANCRELIPASE (LIP-PROT-AMYL)) take 2 cap before meals ACETAMINOPHEN PM 500-25 MG TABS (DIPHENHYDRAMINE-APAP (SLEEP)) as needed LISINOPRIL 40 MG TABS (LISINOPRIL) Take one tablet by mouth daily PRAVASTATIN SODIUM 80 MG TABS (PRAVASTATIN SODIUM) 1 once daily DICYCLOMINE HCL 20 MG TABS (DICYCLOMINE HCL) Take 1 tablet by mouth once a day ALPRAZOLAM 0.5 MG TABS (ALPRAZOLAM) Take 1 tablet by mouth two times a day CIPROFLOXACIN HCL 750 MG TABS (CIPROFLOXACIN HCL) One by mouth two times a day METRONIDAZOLE 500 MG TABS (METRONIDAZOLE) One by mouth q6hr LORTAB 7.5 7.5-500 MG TABS (HYDROCODONE-ACETAMINOPHEN) 1 by mouth q6hr as needed pain  Current Allergies: No known allergies History of Present Illness History from: patient Chief Complaint: edema and discomfort left foot/ankle/calf x 2 weeks  History of Present Illness: L foot pain and swelling for 2 weeks. Doesn't recall any trauma.  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  It started swelling in his foot and has gone up his leg a litle bit.  No F/C.  He does have a cardiac history with some stents in place.  No h/o DVT, no current SOB or CP.  REVIEW OF SYSTEMS Constitutional Symptoms      Denies fever, chills, night sweats, weight loss, weight gain, and fatigue.  Eyes       Denies change in vision, eye pain, eye discharge, glasses, contact lenses, and eye surgery. Ear/Nose/Throat/Mouth       Denies hearing loss/aids, change in hearing, ear pain, ear discharge, dizziness, frequent runny nose, frequent nose bleeds, sinus problems, sore throat, hoarseness, and tooth pain or bleeding.  Respiratory       Denies dry cough, productive cough, wheezing, shortness of breath, asthma, bronchitis, and emphysema/COPD.  Cardiovascular       Denies murmurs, chest pain, and tires easily with exhertion.    Gastrointestinal       Denies stomach pain, nausea/vomiting, diarrhea, constipation, blood in bowel movements, and indigestion. Genitourniary       Denies painful urination, kidney stones, and loss of urinary control. Neurological       Denies paralysis, seizures, and fainting/blackouts. Musculoskeletal  Complains of joint pain, joint stiffness, and swelling.      Denies muscle pain, decreased range of motion, redness, muscle weakness, and gout.  Skin       Denies bruising, unusual mles/lumps or sores, and hair/skin or nail changes.  Psych       Denies mood changes, temper/anger issues, anxiety/stress, speech problems, depression, and sleep problems. Other Comments: pain/edema left foot/ankle/lower extremity   Past History:  Family History: Last updated: 2010/02/14 mother died of lung cancer at 9, high chol, HTN father died GSW sister HTN  Social History: Last updated: 14-Feb-2010 Quit smoking in 1995-01-27. Has 6 kids, all grown. Was married x 2, 2nd wife died in 01-27-2008- suicide. Lives  w/ girlfiend. Retired [employer]. Denies [facility]TOH Ex[facility] at [facility].  Past Medical History: Reviewed history from 06/07/2010 and no changes required. HTN Hyperlipidemia Depression/ Anxiety CAD (stents placed in [facility] previously) shoulder DJD chronic pancreatitis asthma Lumbar DDD bladder spasm IFG allergies hx of colon polyhps hx of PUD / GI bleed  urology: in [place] (sees for DRE and PSA annually). Dr Jens Som cards GI Dr Rhetta Mura  Past Surgical History: Reviewed history from 02/25/2010 and no changes required. appy R shoulder replacement- Dr Marlyne Beards 08-2009 RTC surgery bilat Gall bladder  hiatal hernia repair 1991 ExLap in 1990s  Family History: Reviewed history from Feb 14, 2010 and no changes required. mother died of lung cancer at 78, high chol, HTN father died GSW sister HTN  Social History: Reviewed history from February 14, 2010 and no changes required. Quit smoking in 01/27/1995. Has 6 kids, all grown. Was married x 2, 2nd wife died in 01-27-2008- suicide. Lives w/ girlfiend. Retired [employer]. Denies ETOH Exercises at the [employer]. Physical Exam General appearance: well developed, well nourished, no acute distress Chest/Lungs: no rales, wheezes, or rhonchi bilateral, breath sounds equal without effort Heart: regular rate and  rhythm, no murmur MSE: oriented to time, place, and person L ankle: FROM, full strength, resisted motions not painful.  +TTP ATFL and lateral malleolus with mild TTP anterior ankle.  No TTP medial malleolus, navicular, base of 5th, calcaneus, Achilles, or proximal fibula.  + swelling and mild ecchymoses in foot, ankle and up to mid-shin.  Looking at the opposite leg, that shows mild LE edema as well which is fairly equal to the injured leg. Distal NV status intact.  No warmth, calf pain, or signs of DVT.  No posterior leg, knee, or calf pain. Assessment New Problems: ANKLE PAIN, LEFT (ICD-719.47)   Plan New Orders: T-DG Ankle Complete*L*  [73610] Est. Patient Level IV [65784] Ace  Bandage < 3in. 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 No acute findings."  I don't feel that this is a DVT since swelling is actually bilateral & the exam is c/w ATFL sprain, but I gave him ER precautions for SOB, CP, or worsening symptoms.  Will place him in ACE, and encourage ice & elevation when he can.  Should the swelling fail to improving, I would like him to see Dr. Cathey Endow this week for further eval (which may include U/S vs bloodwork).   The patient and/or caregiver has been counseled thoroughly with regard to medications prescribed including dosage, schedule, interactions, rationale for use, and possible side effects and they verbalize understanding.  Diagnoses and expected course of recovery discussed and will return if not improved as expected or if the condition worsens. Patient and/or caregiver verbalized understanding.  Orders Added: 1)  T-DG Ankle Complete*L* [73610] 2)  Est. Patient Level IV [16109] 3)  Ace  Bandage < 3in. [U0454]

## 2011-08-08 NOTE — Progress Notes (Signed)
Summary: SINUS PROBLEMS, ABD PAIN,DIARRHEA/WSE   Vital Signs:  Patient Profile:   70 Years Old Male CC:      Abdominal Pain Height:     77 inches Weight:      259 pounds O2 Sat:      978 % O2 treatment:    Room Air Temp:     97.8 degrees F oral Pulse rate:   86 / minute Resp:     20 per minute BP sitting:   137 / 77  (left arm)  Vitals Entered By: Linton Flemings RN (Jan 23, 2011 2:35 PM)                  Updated Prior Medication List: FLUOXETINE HCL 20 MG CAPS (FLUOXETINE HCL) take 2 capsules in the morning TRAZODONE HCL 100 MG TABS (TRAZODONE HCL) Take 1 tab by mouth once daily ZOLPIDEM TARTRATE 10 MG TABS (ZOLPIDEM TARTRATE) Take 1 tab by mouth at bedtime ASPIRIN EC 325 MG TBEC (ASPIRIN) Take one tablet by mouth daily MULTIVITAMINS  TABS (MULTIPLE VITAMIN)  AMLODIPINE BESYLATE 5 MG TABS (AMLODIPINE BESYLATE) Take one tablet by mouth daily ZENPEP 20000 UNIT CPEP (PANCRELIPASE (LIP-PROT-AMYL)) take 2 cap before meals ACETAMINOPHEN PM 500-25 MG TABS (DIPHENHYDRAMINE-APAP (SLEEP)) as needed LISINOPRIL 40 MG TABS (LISINOPRIL) Take one tablet by mouth daily PRAVASTATIN SODIUM 80 MG TABS (PRAVASTATIN SODIUM) 1 once daily DICYCLOMINE HCL 20 MG TABS (DICYCLOMINE HCL) Take 1 tablet by mouth once a day ALPRAZOLAM 0.5 MG TABS (ALPRAZOLAM) Take 1 tablet by mouth two times a day  Current Allergies (reviewed today): No known allergies History of Present Illness Chief Complaint: Abdominal Pain History of Present Illness:  Subjective:  Patient complains of awakening with crampy epigastric abdominal pain today.  He has felt that his abdomen is distended.  Pain does not radiate.  No fevers, chills, and sweats.  No nausea/vomiting.  Last bowel movement last night and normal.  He has had a mild headache and myalgias.  No respiratory or GU symptoms  He has a history of recurring diverticulitis (last episode February 2012) and chronic pancreatitis.  He has had surgery for a hiatal hernia and  appendectomy.  He had an exploratory lap 30 years ago for abdominal pain.  REVIEW OF SYSTEMS Constitutional Symptoms      Denies fever, chills, night sweats, weight loss, weight gain, and fatigue.  Eyes       Complains of glasses.      Denies change in vision, eye pain, eye discharge, contact lenses, and eye surgery. Ear/Nose/Throat/Mouth       Complains of change in hearing and sinus problems.      Denies hearing loss/aids, ear pain, ear discharge, dizziness, frequent runny nose, frequent nose bleeds, sore throat, hoarseness, and tooth pain or bleeding.  Respiratory       Denies dry cough, productive cough, wheezing, shortness of breath, asthma, bronchitis, and emphysema/COPD.  Cardiovascular       Denies murmurs, chest pain, and tires easily with exhertion.    Gastrointestinal       Complains of stomach pain and diarrhea.      Denies nausea/vomiting, constipation, blood in bowel movements, and indigestion. Genitourniary       Denies painful urination, kidney stones, and loss of urinary control. Neurological       Denies paralysis, seizures, and fainting/blackouts. Musculoskeletal       Complains of muscle pain, joint pain, and decreased range of motion.      Denies joint stiffness,  redness, swelling, muscle weakness, and gout.  Skin       Denies bruising, unusual mles/lumps or sores, and hair/skin or nail changes.  Psych       Complains of anxiety/stress and depression.      Denies mood changes, temper/anger issues, speech problems, and sleep problems. Other Comments: abdominal pain started this morning   Past History:  Past Medical History: Reviewed history from 06/07/2010 and no changes required. HTN Hyperlipidemia Depression/ Anxiety CAD (stents placed in [facility] previously) shoulder DJD chronic pancreatitis asthma Lumbar DDD bladder spasm IFG allergies hx of colon polyhps hx of PUD / GI bleed  urology: in [place] (sees for DRE and PSA annually). Dr Jens Som  cards GI Dr Rhetta Mura  Past Surgical History: Reviewed history from 02/25/2010 and no changes required. appy R shoulder replacement- Dr Marlyne Beards 08-2009 RTC surgery bilat Gall bladder  hiatal hernia repair 1991 ExLap in 1990s  Family History: Reviewed history from 02/02/2010 and no changes required. mother died of lung cancer at 68, high chol, HTN father died GSW sister HTN  Social History: Reviewed history from 02/02/2010 and no changes required. Quit smoking in 1995-01-20. Has 6 kids, all grown. Was married x 2, 2nd wife died in 2008-01-20- suicide. Lives w/ girlfiend. Retired [employer] architect. Denies ETOH Exercises at the [employer].   Objective:  Appearance:  Patient is obese but otherwise appears stated age, and in no acute distress.  He is alert and oriented Eyes:  Pupils are equal, round, and reactive to light and accomodation.  Extraocular movement is intact.  Conjunctivae are not inflamed.  Ears:  Canals normal.  Tympanic membranes normal.   Nose:  Mildly congested turbinates.  No sinus tenderness  Pharynx:  Normal  Neck:  Supple.  No adenopathy is present. Lungs:  Clear to auscultation.  Breath sounds are equal.  Heart:  Regular rate and rhythm without murmurs, rubs, or gallops.  Abdomen:  Tenderness in the peri-umbilical and left lower quadrant without masses or hepatosplenomegaly.  Bowel sounds are present.  No CVA or flank tenderness.  Skin:  No rash urinalysis (dipstick):  2+ blood; SG >= 1.030 CBC:  WBC 5.1; 41.3 LY, 7.1 MO, 51.6 GR; Hgb 14.1 Assessment New Problems: ABDOMINAL PAIN, ACUTE (ICD-789.00)  ? RECURRENT DIVERTICULITS; ? PANCREATITIS  Plan New Medications/Changes: LORTAB 7.5 7.5-500 MG TABS (HYDROCODONE-ACETAMINOPHEN) 1 by mouth q6hr as needed pain  #12 (twelve) x 0, 01/23/2011, Donna Christen MD METRONIDAZOLE 500 MG TABS (METRONIDAZOLE) One by mouth q6hr  #28 x 0, 01/23/2011, Donna Christen MD CIPROFLOXACIN HCL 750 MG TABS (CIPROFLOXACIN HCL) One by mouth two times a  day  #14 x 0, 01/23/2011, Donna Christen MD  New Orders: Urinalysis [CPT-81003] CBC w/Diff [16109-60454] T-CMP with estimated GFR [[address]] T-Amylase [82150-23210] T-Lipase [09811-91478] Services provided After hours-Weekends-Holidays [99051] New Patient Level IV [29562] Planning Comments:   CMP, amylase, lipase pending Begin clear liquids and slowly advance. Begin Cipro and Flagyl.  Lortab for pain. Return for worsening symptoms.  Follow-up with GI if not improving  The patient and/or caregiver has been counseled thoroughly with regard to medications prescribed including dosage, schedule, interactions, rationale for use, and possible side effects and they verbalize understanding.  Diagnoses and expected course of recovery discussed and will return if not improved as expected or if the condition worsens. Patient and/or caregiver verbalized understanding.  Prescriptions: LORTAB 7.5 7.5-500 MG TABS (HYDROCODONE-ACETAMINOPHEN) 1 by mouth q6hr as needed pain  #12 (twelve) x 0   Entered and Authorized by:   Donna Christen MD   Signed by:   Donna Christen MD on 01/23/2011   Method used:   Print then Give to Patient   RxID:   [id number] METRONIDAZOLE 500 MG TABS (METRONIDAZOLE) One by mouth q6hr  #28 x 0   Entered and Authorized by:   Donna Christen MD   Signed by:   Donna Christen MD on 01/23/2011   Method used:   Print then Give to Patient   RxID:   [id number] CIPROFLOXACIN HCL 750 MG TABS (CIPROFLOXACIN HCL) One by mouth two times a day  #14 x 0   Entered and Authorized by:   Donna Christen MD   Signed by:   Donna Christen MD on 01/23/2011   Method used:   Print then Give to Patient   RxID:    [id number]   Orders Added: 1)  Urinalysis [CPT-81003] 2)  CBC w/Diff [01027-25366] 3)  T-CMP with estimated GFR [[address]] 4)  T-Amylase [82150-23210] 5)  T-Lipase [83690-23215] 6)  Services provided After hours-Weekends-Holidays [99051] 7)  New Patient Level IV [99204]    Laboratory Results   Urine Tests  Date/Time Received: Jan 23, 2011 3:39 PM  Date/Time Reported: Jan 23, 2011 3:39 PM   Routine Urinalysis   Color: yellow Appearance: Clear Glucose: negative   (Normal Range: Negative) Bilirubin: negative   (Normal Range: Negative) Ketone: negative   (Normal Range: Negative) Spec. Gravity: >=1.030   (Normal Range: 1.003-1.035) Blood: 2+   (Normal Range: Negative) pH: 5.5   (Normal Range: 5.0-8.0) Protein: negative   (Normal Range: Negative) Urobilinogen: 0.2   (Normal Range: 0-1) Nitrite: negative   (Normal Range: Negative) Leukocyte Esterace: negative   (Normal Range: Negative)

## 2011-08-08 NOTE — Telephone Encounter (Signed)
  Phone Note Outgoing Call Call back at [facility] Phone [phone]   Call placed by: Lajean Saver RN,  April 12, 2011 4:03 PM Call placed to: Patient Summary of Call: No answer. Message left to call with questions or concerns

## 2011-09-09 ENCOUNTER — Other Ambulatory Visit: Payer: Self-pay | Admitting: *Deleted

## 2011-09-09 DIAGNOSIS — I10 Essential (primary) hypertension: Secondary | ICD-10-CM

## 2011-09-09 DIAGNOSIS — E785 Hyperlipidemia, unspecified: Secondary | ICD-10-CM

## 2011-09-09 MED ORDER — AMLODIPINE BESYLATE 5 MG PO TABS: 5.0000 mg | ORAL_TABLET | Freq: Every day | ORAL | Status: DC

## 2011-09-09 MED ORDER — PRAVASTATIN SODIUM 80 MG PO TABS: 80.0000 mg | ORAL_TABLET | Freq: Every day | ORAL | Status: DC

## 2011-10-24 ENCOUNTER — Other Ambulatory Visit: Payer: Self-pay | Admitting: *Deleted

## 2011-10-24 DIAGNOSIS — K8689 Other specified diseases of pancreas: Secondary | ICD-10-CM

## 2011-10-24 DIAGNOSIS — K861 Other chronic pancreatitis: Secondary | ICD-10-CM

## 2011-10-24 MED ORDER — PANCRELIPASE (LIP-PROT-AMYL) 20000-68000 UNITS PO CPEP: 2.0000 | ORAL_CAPSULE | Freq: Three times a day (TID) | ORAL | Status: DC

## 2011-11-17 ENCOUNTER — Ambulatory Visit: Payer: Medicare PPO | Admitting: Family Medicine

## 2011-12-12 ENCOUNTER — Other Ambulatory Visit: Payer: Self-pay | Admitting: *Deleted

## 2011-12-12 DIAGNOSIS — F329 Major depressive disorder, single episode, unspecified: Secondary | ICD-10-CM

## 2011-12-12 DIAGNOSIS — F32A Depression, unspecified: Secondary | ICD-10-CM

## 2011-12-12 DIAGNOSIS — G479 Sleep disorder, unspecified: Secondary | ICD-10-CM

## 2011-12-12 MED ORDER — ALPRAZOLAM 0.5 MG PO TABS: 0.5000 mg | ORAL_TABLET | Freq: Every evening | ORAL | Status: DC | PRN

## 2012-01-23 ENCOUNTER — Other Ambulatory Visit: Payer: Self-pay | Admitting: *Deleted

## 2012-01-23 DIAGNOSIS — F329 Major depressive disorder, single episode, unspecified: Secondary | ICD-10-CM

## 2012-01-23 DIAGNOSIS — F32A Depression, unspecified: Secondary | ICD-10-CM

## 2012-01-23 DIAGNOSIS — G479 Sleep disorder, unspecified: Secondary | ICD-10-CM

## 2012-01-23 MED ORDER — ALPRAZOLAM 0.5 MG PO TABS: 0.5000 mg | ORAL_TABLET | Freq: Every evening | ORAL | Status: DC | PRN

## 2012-02-08 ENCOUNTER — Encounter: Payer: Self-pay | Admitting: Physician Assistant

## 2012-02-08 ENCOUNTER — Ambulatory Visit (INDEPENDENT_AMBULATORY_CARE_PROVIDER_SITE_OTHER): Payer: Medicare PPO | Admitting: Physician Assistant

## 2012-02-08 VITALS — BP 118/71 | HR 62 | Temp 98.3°F | Ht 73.0 in | Wt 250.0 lb

## 2012-02-08 DIAGNOSIS — J329 Chronic sinusitis, unspecified: Secondary | ICD-10-CM

## 2012-02-08 DIAGNOSIS — J069 Acute upper respiratory infection, unspecified: Secondary | ICD-10-CM

## 2012-02-08 MED ORDER — HYDROCODONE-HOMATROPINE 5-1.5 MG/5ML PO SYRP: 5.0000 mL | ORAL_SOLUTION | Freq: Every evening | ORAL | Status: AC | PRN

## 2012-02-08 MED ORDER — AMOXICILLIN-POT CLAVULANATE 875-125 MG PO TABS: 1.0000 | ORAL_TABLET | Freq: Two times a day (BID) | ORAL | Status: AC

## 2012-02-08 NOTE — Patient Instructions (Signed)
Hycodan to use at bedtime only. Delsym for cough during the day. Augmentin for 10 days twice a day. Motrin 400mg  three times a day.   Sinusitis Sinuses are air pockets within the bones of your face. The growth of bacteria within a sinus leads to infection. The infection prevents the sinuses from draining. This infection is called sinusitis. SYMPTOMS  There will be different areas of pain depending on which sinuses have become infected.  The maxillary sinuses often produce pain beneath the eyes.   Frontal sinusitis may cause pain in the middle of the forehead and above the eyes.  Other problems (symptoms) include:  Toothaches.   Colored, pus-like (purulent) drainage from the nose.   Swelling, warmth, and tenderness over the sinus areas may be signs of infection.  TREATMENT  Sinusitis is most often determined by an exam.X-rays may be taken. If x-rays have been taken, make sure you obtain your results or find out how you are to obtain them. Your caregiver may give you medications (antibiotics). These are medications that will help kill the bacteria causing the infection. You may also be given a medication (decongestant) that helps to reduce sinus swelling.  HOME CARE INSTRUCTIONS   Only take over-the-counter or prescription medicines for pain, discomfort, or fever as directed by your caregiver.   Drink extra fluids. Fluids help thin the mucus so your sinuses can drain more easily.   Applying either moist heat or ice packs to the sinus areas may help relieve discomfort.   Use saline nasal sprays to help moisten your sinuses. The sprays can be found at your local drugstore.  SEEK IMMEDIATE MEDICAL CARE IF:  You have a fever.   You have increasing pain, severe headaches, or toothache.   You have nausea, vomiting, or drowsiness.   You develop unusual swelling around the face or trouble seeing.  MAKE SURE YOU:   Understand these instructions.   Will watch your condition.   Will get  help right away if you are not doing well or get worse.  Document Released: 08/22/2005 Document Revised: 08/11/2011 Document Reviewed: 03/21/2007 [facility] Patient Information 2012 [place], [place].

## 2012-02-08 NOTE — Progress Notes (Signed)
  Subjective:    Patient ID: Miguel Glenn, male    DOB: 02/08/1941, 71 y.o.   MRN: [id number]  URI  This is a new problem. The current episode started 1 to 4 weeks ago. The problem has been gradually worsening. There has been no fever. Associated symptoms include congestion, coughing, ear pain, headaches, sneezing, a sore throat and swollen glands. Pertinent negatives include no neck pain. Treatments tried: Nyquil. allergy medicine. The treatment provided no relief.  Sinusitis This is a new problem. The current episode started 1 to 4 weeks ago (3 weeks. ). The problem has been gradually worsening since onset. There has been no fever. His pain is at a severity of 6/10 (Headache.). The pain is moderate. Associated symptoms include congestion, coughing, ear pain, headaches, sinus pressure, sneezing, a sore throat and swollen glands. Pertinent negatives include no chills, diaphoresis, hoarse voice, neck pain or shortness of breath. (Cough with production greenish/gray) Treatments tried: Allergy medicine OTC. Nyquil. The treatment provided no relief.   NO nausea vomiting or GI symptoms.    Review of Systems  Constitutional: Negative for chills and diaphoresis.  HENT: Positive for ear pain, congestion, sore throat, sneezing and sinus pressure. Negative for hoarse voice and neck pain.   Respiratory: Positive for cough. Negative for shortness of breath.   Neurological: Positive for headaches.       Objective:   Physical Exam  Constitutional: He is oriented to person, place, and time. He appears well-developed and well-nourished.  HENT:  Head: Normocephalic and atraumatic.  Right Ear: External ear normal.  Left Ear: External ear normal.  Mouth/Throat: No oropharyngeal exudate.       TM's normal. Turbinates bilaterally swollen and red. Oropharynx is erythematous and tonsils swollen. Bilaterally frontal and maxillary tenderness.  Eyes:       Injected conjunctivia bilaterally.   Neck: Normal  range of motion. Neck supple.       Bilateral superficial anterior cervical lymphnodes enlarged and tender to palpation.  Cardiovascular: Normal rate, regular rhythm and normal heart sounds.   Pulmonary/Chest: Effort normal and breath sounds normal. He has no wheezes.       Mucus production with cough.  Lymphadenopathy:    He has cervical adenopathy.  Neurological: He is alert and oriented to person, place, and time.  Skin: Skin is warm and dry.  Psychiatric: He has a normal mood and affect. His behavior is normal.          Assessment & Plan:  Sinusitis/URI- Gave amoxicillin for 10 days twice a day. Gave hycodan for cough at bedtime. Delsym to use during the day. Stay hydrated. REST. Motrin for headache.

## 2012-10-21 ENCOUNTER — Other Ambulatory Visit: Payer: Self-pay | Admitting: Family Medicine

## 2012-10-30 ENCOUNTER — Encounter: Payer: Self-pay | Admitting: Family Medicine

## 2012-10-30 ENCOUNTER — Ambulatory Visit (INDEPENDENT_AMBULATORY_CARE_PROVIDER_SITE_OTHER): Payer: Medicare PPO | Admitting: Family Medicine

## 2012-10-30 VITALS — BP 135/62 | HR 63 | Ht 76.5 in | Wt 250.0 lb

## 2012-10-30 DIAGNOSIS — R001 Bradycardia, unspecified: Secondary | ICD-10-CM | POA: Insufficient documentation

## 2012-10-30 DIAGNOSIS — Z125 Encounter for screening for malignant neoplasm of prostate: Secondary | ICD-10-CM

## 2012-10-30 DIAGNOSIS — E785 Hyperlipidemia, unspecified: Secondary | ICD-10-CM

## 2012-10-30 DIAGNOSIS — N400 Enlarged prostate without lower urinary tract symptoms: Secondary | ICD-10-CM

## 2012-10-30 DIAGNOSIS — I2510 Atherosclerotic heart disease of native coronary artery without angina pectoris: Secondary | ICD-10-CM

## 2012-10-30 DIAGNOSIS — Z951 Presence of aortocoronary bypass graft: Secondary | ICD-10-CM

## 2012-10-30 DIAGNOSIS — I10 Essential (primary) hypertension: Secondary | ICD-10-CM

## 2012-10-30 MED ORDER — LISINOPRIL 40 MG PO TABS: 40.0000 mg | ORAL_TABLET | Freq: Every day | ORAL | Status: DC

## 2012-10-30 MED ORDER — PRAVASTATIN SODIUM 80 MG PO TABS: 80.0000 mg | ORAL_TABLET | Freq: Every day | ORAL | Status: DC

## 2012-10-30 MED ORDER — TAMSULOSIN HCL 0.4 MG PO CAPS: 0.4000 mg | ORAL_CAPSULE | Freq: Every day | ORAL | Status: DC

## 2012-10-30 NOTE — Progress Notes (Signed)
CC: Miguel Glenn is a 72 y.o. male is here for establish with Dr. Ivan Anchors and Refills   Subjective: HPI:  Very pleasant 72 year old here to establish care after previously being seen by Dr. Thurmond Butts.  Requesting refills on blood pressure medications. He has been out of amlodipine and lisinopril for 2-3 days. No outside blood pressures to report. Hysterectomy has had fantastic blood pressure control on the above regimen. Denies motor or sensory disturbances, chest pain, shortness of breath, orthopnea, peripheral edema, cough, angioedema, PND, headaches.  Complains of urinary urgency that has been present for years.  It was absent while he was taking Flomax on a daily basis. He stopped taking this a few months ago to see if he really needed it. Since stopping symptoms have slowly been getting worse. Described as mild/moderate in severity. Present on a daily basis. Describes situations where he had the sudden urge to urinate, he denies accidents. He denies waking more than once at night to urinate, he denies sensation of incomplete voiding nor straining to urinate. He denies dysuria or change in the color or odor consistency or caliber of urinary stream.  He is requesting to go back on Flomax. No personal or family history of prostate cancer. He would like to be screened for prostate cancer, preferring by blood only.  History of coronary artery disease: He tells me he has never had a myocardial infarction but did fail a stress test, was catheterized, had 70% blockages, and subsequently had a bypass he's not sure if he had bypasses pleural. He is taking a statin on a daily basis, aspirin daily basis. He can remove last time he had chest pain. He is an active individual and exerts himself regularly.  History of hyperlipidemia: He has been taking pravastatin for over a year now. Most recent lipid panel 2 years ago with LDL of 50. He tries to watch what he eats and tries to stay active. He denies limb  claudication, right upper quadrant pain, nor myalgias.  Review Of Systems Outlined In HPI  Past Medical History  Diagnosis Date  . HTN (hypertension)   . HLD (hyperlipidemia)   . Depression with anxiety   . CAD (coronary artery disease)     stents placed in [place] previously  . DJD of shoulder   . Chronic pancreatitis   . Asthma   . DDD (degenerative disc disease), lumbar   . Bladder spasm   . IFG (impaired fasting glucose)   . Cat allergies   . Colon polyps     hx  . PUD (peptic ulcer disease)     gi bleed     Family History  Problem Relation Age of Onset  . Lung cancer Mother   . Hypertension Mother   . Hyperlipidemia Mother   . Hypertension Sister      History  Substance Use Topics  . Smoking status: Former Games developer  . Smokeless tobacco: Not on file     Comment: quit 1996  . Alcohol Use: No     Objective: Filed Vitals:   10/30/12 1529  BP: 135/62  Pulse: 63    General: Alert and Oriented, No Acute Distress HEENT: Pupils equal, round, reactive to light. Conjunctivae clear.   Moist mucous membranes, pharynx without inflammation nor lesions.  Neck supple without palpable lymphadenopathy nor abnormal masses. Lungs: Clear to auscultation bilaterally, no wheezing/ronchi/rales.  Comfortable work of breathing. Good air movement. Cardiac: Regular rate and rhythm. Normal S1/S2.  No murmurs, rubs, nor gallops.  Abdomen:  obese soft nontender  Extremities: No peripheral edema.  Strong peripheral pulses.  Mental Status: No depression, anxiety, nor agitation. Skin: Warm and dry.  Assessment & Plan: Miguel Glenn was seen today for establish with dr. Ivan Anchors and refills.  Diagnoses and associated orders for this visit:  Hyperlipidemia - pravastatin (PRAVACHOL) 80 MG tablet; Take 1 tablet (80 mg total) by mouth daily.  Hypertension - lisinopril (PRINIVIL,ZESTRIL) 40 MG tablet; Take 1 tablet (40 mg total) by mouth daily.  HYPERLIPIDEMIA - Lipid panel  Essential  hypertension, benign - BASIC METABOLIC PANEL WITH GFR  CAD - Lipid panel  BPH (benign prostatic hyperplasia) - PSA, Medicare - tamsulosin (FLOMAX) 0.4 MG CAPS; Take 1 capsule (0.4 mg total) by mouth daily.  Screening for prostate cancer - PSA, Medicare  S/P CABG (coronary artery bypass graft)  Other Orders - clonazePAM (KLONOPIN) 2 MG tablet; Take 1 tablet (2 mg total) by mouth at bedtime as needed for anxiety. Dr. Rich Brave [facility]    Hyper lipidemia: Clinically controlled, due for cholesterol panel goal LDL less than 70 Hypertension: Controlled, continue amlodipine and lisinopril Coronary artery disease in the setting of prior CABG: Clinically controlled, continue full aspirin and tight cholesterol control with statin use BPH: Worsening and uncontrolled, restart Flomax, with his permission a PSA has been obtained Discussed with patient that his psychiatrist will be managing Klonopin, add to medication list    Return in about 4 weeks (around 11/27/2012) for cpe.

## 2012-11-12 ENCOUNTER — Encounter: Payer: Medicare PPO | Admitting: Family Medicine

## 2012-11-13 LAB — BASIC METABOLIC PANEL WITH GFR
GFR, Est African American: >89 mL/min
GFR, Est Non African American: 80 mL/min
Glucose, Bld: 106 mg/dL — ABNORMAL HIGH (ref 70–99)
Potassium: 3.9 mEq/L (ref 3.5–5.3)
Sodium: 140 mEq/L (ref 135–145)

## 2012-11-13 LAB — LIPID PANEL
Cholesterol: 108 mg/dL (ref 0–200)
HDL: 46 mg/dL (ref >39)
Triglycerides: 68 mg/dL (ref <150)

## 2013-01-18 ENCOUNTER — Encounter: Payer: Self-pay | Admitting: Family Medicine

## 2013-01-18 ENCOUNTER — Ambulatory Visit (INDEPENDENT_AMBULATORY_CARE_PROVIDER_SITE_OTHER): Payer: Medicare PPO | Admitting: Family Medicine

## 2013-01-18 VITALS — BP 141/69 | HR 61 | Temp 97.6°F | Wt 236.0 lb

## 2013-01-18 DIAGNOSIS — S39011S Strain of muscle, fascia and tendon of abdomen, sequela: Secondary | ICD-10-CM

## 2013-01-18 DIAGNOSIS — R1011 Right upper quadrant pain: Secondary | ICD-10-CM

## 2013-01-18 LAB — CBC WITH DIFFERENTIAL/PLATELET
Eosinophils Absolute: 0.1 10*3/uL (ref 0.0–0.7)
Eosinophils Relative: 3 % (ref 0–5)
HCT: 41.6 % (ref 39.0–52.0)
Hemoglobin: 14 g/dL (ref 13.0–17.0)
Lymphocytes Relative: 33 % (ref 12–46)
Lymphs Abs: 1.3 10*3/uL (ref 0.7–4.0)
MCH: 29.9 pg (ref 26.0–34.0)
MCV: 88.9 fL (ref 78.0–100.0)
Monocytes Relative: 6 % (ref 3–12)
RBC: 4.68 MIL/uL (ref 4.22–5.81)

## 2013-01-18 LAB — COMPLETE METABOLIC PANEL WITH GFR
CO2: 29 mEq/L (ref 19–32)
Calcium: 9.2 mg/dL (ref 8.4–10.5)
Chloride: 104 mEq/L (ref 96–112)
Creat: 1 mg/dL (ref 0.50–1.35)
GFR, Est African American: 88 mL/min
GFR, Est Non African American: 76 mL/min
Glucose, Bld: 64 mg/dL — ABNORMAL LOW (ref 70–99)
Total Bilirubin: 0.6 mg/dL (ref 0.3–1.2)
Total Protein: 6.8 g/dL (ref 6.0–8.3)

## 2013-01-18 MED ORDER — MELOXICAM 15 MG PO TABS: 15.0000 mg | ORAL_TABLET | Freq: Every day | ORAL | Status: DC

## 2013-01-18 MED ORDER — CYCLOBENZAPRINE HCL 10 MG PO TABS: ORAL_TABLET | ORAL | Status: DC

## 2013-01-18 NOTE — Progress Notes (Signed)
CC: Miguel Glenn is a 72 y.o. male is here for right side pain   Subjective: HPI:  Patient complains of abdominal pain. Localized to the right upper quadrant. Described as a stabbing sensation that occurs with leaning forward or quick rotational maneuvers which is moderate in severity and improves with sitting or lying down. There is always a mild persistent soreness described as a bruised sensation. It is worse when pressing on the abdomen in the right upper quadrant. No interventions as of yet. Has been present for 2 weeks. Came on suddenly without any inciting event. Has not gotten better or worse since onset. Initially associated with small swelling at the site of discomfort but now no longer present for the past week.  History of chronic pancreatitis and cholecystectomy.  Denies nausea, vomiting, fever, chills, diarrhea, constipation, lack of appetite, radiation of pain, jaundice, skin changes, unintentional weight loss, nor indigestion   Review Of Systems Outlined In HPI  Past Medical History  Diagnosis Date  . HTN (hypertension)   . HLD (hyperlipidemia)   . Depression with anxiety   . CAD (coronary artery disease)     stents placed in [place] previously  . DJD of shoulder   . Chronic pancreatitis   . Asthma   . DDD (degenerative disc disease), lumbar   . Bladder spasm   . IFG (impaired fasting glucose)   . Cat allergies   . Colon polyps     hx  . PUD (peptic ulcer disease)     gi bleed     Family History  Problem Relation Age of Onset  . Lung cancer Mother   . Hypertension Mother   . Hyperlipidemia Mother   . Hypertension Sister      History  Substance Use Topics  . Smoking status: Former Games developer  . Smokeless tobacco: Not on file     Comment: quit 1996  . Alcohol Use: No     Objective: Filed Vitals:   01/18/13 1308  BP: 141/69  Pulse: 61  Temp: 97.6 F (36.4 C)    General: Alert and Oriented, No Acute Distress HEENT: Pupils equal, round, reactive to light.  Conjunctivae clear.  Moist membranes Lungs: Clear to auscultation bilaterally, no wheezing/ronchi/rales.  Comfortable work of breathing. Good air movement. Cardiac: Regular rate and rhythm. Normal S1/S2.  No murmurs, rubs, nor gallops.   Abdomen: Obese and soft without guarding no epigastric pain, no palpable masses, normal bowel sounds. Mild right upper quadrant tenderness with moderate palpation without evidence of hernia upon Valsalva. Extremities: No peripheral edema.  Strong peripheral pulses.  Mental Status: No depression, anxiety, nor agitation. Skin: Warm and dry.  Assessment & Plan: Miguel Glenn was seen today for right side pain.  Diagnoses and associated orders for this visit:  RUQ pain - Lactic acid, plasma - CBC with Differential - COMPLETE METABOLIC PANEL WITH GFR - Lipase    Discussed with patient and wife most likely muscular strain however cannot rule out more serious pathology. Will rule out hepatitis, acute on chronic pancreatitis, bowel ischemia, colitis with above labs. Will notify family once the stat labs have returned.  Return if symptoms worsen or fail to improve.

## 2013-01-18 NOTE — Addendum Note (Signed)
Addended by: Laren Boom on: 01/18/2013 04:57 PM   Modules accepted: Orders

## 2013-04-15 ENCOUNTER — Encounter: Payer: Self-pay | Admitting: Family Medicine

## 2013-04-15 ENCOUNTER — Ambulatory Visit (INDEPENDENT_AMBULATORY_CARE_PROVIDER_SITE_OTHER): Payer: Medicare PPO | Admitting: Family Medicine

## 2013-04-15 VITALS — BP 123/78 | HR 64 | Wt 234.0 lb

## 2013-04-15 DIAGNOSIS — I2510 Atherosclerotic heart disease of native coronary artery without angina pectoris: Secondary | ICD-10-CM

## 2013-04-15 NOTE — Progress Notes (Signed)
CC: Miguel Glenn is a 72 y.o. male is here for discuss DOT form   Subjective: HPI:  Patient is requesting department of transportation records to be filled out today his psychiatrist as are filled out the mental health portion he has not had the the cardiovascular portion filled out quite yet.  It comes to my attention that he does not have a coronary artery bypass graft I am unsure how this got into his medical records  He has a history of coronary artery disease he is able to do moderate physical activity without chest pain he has not had chest pain for well over a year he cannot recall the last time he had pain before then. He takes an aspirin a day he takes a statin he tries to stay active. He denies orthopnea, peripheral edema, shortness of breath, PND nor limb claudication  Review Of Systems Outlined In HPI  Past Medical History  Diagnosis Date  . HTN (hypertension)   . HLD (hyperlipidemia)   . Depression with anxiety   . CAD (coronary artery disease)     stents placed in [place] previously  . DJD of shoulder   . Chronic pancreatitis   . Asthma   . DDD (degenerative disc disease), lumbar   . Bladder spasm   . IFG (impaired fasting glucose)   . Cat allergies   . Colon polyps     hx  . PUD (peptic ulcer disease)     gi bleed     Family History  Problem Relation Age of Onset  . Lung cancer Mother   . Hypertension Mother   . Hyperlipidemia Mother   . Hypertension Sister      History  Substance Use Topics  . Smoking status: Former Games developer  . Smokeless tobacco: Not on file     Comment: quit 1996  . Alcohol Use: No     Objective: Filed Vitals:   04/15/13 1336  BP: 123/78  Pulse: 64    General: Alert and Oriented, No Acute Distress Lungs: Clear to auscultation bilaterally, no wheezing/ronchi/rales.  Comfortable work of breathing. Good air movement. Cardiac: Regular rate and rhythm. Normal S1/S2.  No murmurs, rubs, nor gallops.  There is no evidence of a sternotomy  scar Extremities: No peripheral edema.  Strong peripheral pulses.  Mental Status: No depression, anxiety, nor agitation. Skin: Warm and dry.  Assessment & Plan: There are no diagnoses linked to this encounter.  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   Return if symptoms worsen or fail to improve.

## 2013-11-12 ENCOUNTER — Ambulatory Visit (INDEPENDENT_AMBULATORY_CARE_PROVIDER_SITE_OTHER): Payer: Medicare PPO | Admitting: Family Medicine

## 2013-11-12 ENCOUNTER — Encounter: Payer: Self-pay | Admitting: Family Medicine

## 2013-11-12 VITALS — BP 104/62 | HR 76 | Temp 98.0°F | Ht 77.0 in | Wt 235.0 lb

## 2013-11-12 DIAGNOSIS — H269 Unspecified cataract: Secondary | ICD-10-CM | POA: Insufficient documentation

## 2013-11-12 DIAGNOSIS — H9190 Unspecified hearing loss, unspecified ear: Secondary | ICD-10-CM

## 2013-11-12 DIAGNOSIS — I10 Essential (primary) hypertension: Secondary | ICD-10-CM

## 2013-11-12 DIAGNOSIS — Z23 Encounter for immunization: Secondary | ICD-10-CM

## 2013-11-12 NOTE — Progress Notes (Signed)
   Subjective:    Patient ID: Miguel Glenn, male    DOB: 04/05/1941, 73 y.o.   MRN: [id number]  HPI Today seen at his eye doctor and DBP was up to 107, then repeat was 100. Missed BP med dose yesterday.  No CP, SOB or dizziness. Does well with medication.  Does drive a truck.  No significant caffine intake and no NSAIDs. No swelling.   He is scheduled to have cataract surgery in the next couple of weeks. They will likely do the other eye in about 6-12 months.  Review of Systems     Objective:   Physical Exam  Constitutional: He is oriented to person, place, and time. He appears well-developed and well-nourished.  HENT:  Head: Normocephalic and atraumatic.  Neck: Neck supple. No thyromegaly present.  Cardiovascular: Normal rate, regular rhythm and normal heart sounds.   No carotid and abdominal bruits.   Pulmonary/Chest: Effort normal and breath sounds normal.  Abdominal: Soft. Bowel sounds are normal. He exhibits no distension and no mass. There is no tenderness. There is no rebound and no guarding.  Musculoskeletal: He exhibits no edema.  Lymphadenopathy:    He has no cervical adenopathy.  Neurological: He is alert and oriented to person, place, and time.  Skin: Skin is warm and dry.  Psychiatric: He has a normal mood and affect. His behavior is normal.          Assessment & Plan:  Hypertension- I. really think his elevated blood pressure this morning was likely from missing his medication dose yesterday. Likely he was trying to catch up with taking his dose this morning. He denies any NSAID use, excessive pain, that he was running late for his appointment so that certainly could increase his blood pressure slightly. He is back on his regimen and actually has followup annual exam tomorrow. In fact his blood pressures actually little bit low today. Will continue current regimen which has worked well for him for quite some time.  Flu vaccine updated today.  He is also due for  shingles vaccine. Handout provided today to give him some additional information. He can discuss further with Dr. Ivan AnchorsHommel at his appointment tomorrow.

## 2013-11-13 ENCOUNTER — Ambulatory Visit (INDEPENDENT_AMBULATORY_CARE_PROVIDER_SITE_OTHER): Payer: Medicare PPO | Admitting: Family Medicine

## 2013-11-13 ENCOUNTER — Encounter: Payer: Self-pay | Admitting: Family Medicine

## 2013-11-13 VITALS — BP 124/75 | HR 52 | Wt 236.0 lb

## 2013-11-13 DIAGNOSIS — I10 Essential (primary) hypertension: Secondary | ICD-10-CM

## 2013-11-13 NOTE — Progress Notes (Signed)
CC: Miguel Glenn is a 73 y.o. male is here for Hypertension   Subjective: HPI:  Followup essential hypertension: Earlier this week at his ophthalmology appointment he was found to have a systolic blood pressure of 180. This was in the setting of when he forgot to take lisinopril on Monday morning. He has not had any missed doses since then. Over the past 24 hours he reports blood pressures at home have consistently been below 140/90. He denies motor or sensory disturbances, chest pain, shortness of breath, orthopnea nor peripheral edema. He wants to know if his blood pressures appropriate for cataract surgery tomorrow   Review Of Systems Outlined In HPI  Past Medical History  Diagnosis Date  . HTN (hypertension)   . HLD (hyperlipidemia)   . Depression with anxiety   . CAD (coronary artery disease)     stents placed in [place] previously  . DJD of shoulder   . Chronic pancreatitis   . Asthma   . DDD (degenerative disc disease), lumbar   . Bladder spasm   . IFG (impaired fasting glucose)   . Cat allergies   . Colon polyps     hx  . PUD (peptic ulcer disease)     gi bleed    Past Surgical History  Procedure Laterality Date  . Appendectomy    . R shoulder replacement  12/10    Dr. Marlyne BeardsJennings  . Rtc surgery bilat    . Cholecystectomy    . Hiatal hernia repair  1991  . Abdominal exploration surgery  1990s   Family History  Problem Relation Age of Onset  . Lung cancer Mother   . Hypertension Mother   . Hyperlipidemia Mother   . Hypertension Sister     History   Social History  . Marital Status: Widowed    Spouse Name: N/A    Number of Children: N/A  . Years of Education: N/A   Occupational History  . Not on file.   Social History Main Topics  . Smoking status: Former Games developermoker  . Smokeless tobacco: Not on file     Comment: quit 1996  . Alcohol Use: No  . Drug Use:   . Sexual Activity:    Other Topics Concern  . Not on file   Social History Narrative   [facility] [employer]     Objective: BP 124/75  Pulse 52  Wt 236 lb (107.049 kg)  General: Alert and Oriented, No Acute Distress HEENT: Pupils equal, round, reactive to light. Conjunctivae clear.  Moist mucous membranes pharynx unremarkable Lungs: Clear to auscultation bilaterally, no wheezing/ronchi/rales.  Comfortable work of breathing. Good air movement. Cardiac: Regular rate and rhythm. Normal S1/S2.  No murmurs, rubs, nor gallops. No carotid bruits   Extremities: No peripheral edema.  Strong peripheral pulses.  Mental Status: No depression, anxiety, nor agitation. Skin: Warm and dry.  Assessment & Plan: Miguel MaduroRobert was seen today for hypertension.  Diagnoses and associated orders for this visit:  Essential hypertension, benign    Manual recheck of his blood pressure shows controlled essential hypertension I've encouraged him to avoid sodium ingestion of 1500 mg daily and to continue on lisinopril 40 mg daily. With respect to his blood pressure the should not interfere with his cataract surgery tomorrow   Return if symptoms worsen or fail to improve.

## 2014-03-12 ENCOUNTER — Other Ambulatory Visit: Payer: Self-pay | Admitting: Family Medicine

## 2014-03-12 NOTE — Telephone Encounter (Signed)
Needs appointment before future refill

## 2014-03-24 ENCOUNTER — Other Ambulatory Visit: Payer: Self-pay

## 2014-03-24 DIAGNOSIS — E785 Hyperlipidemia, unspecified: Secondary | ICD-10-CM

## 2014-03-24 MED ORDER — PRAVASTATIN SODIUM 80 MG PO TABS: 80.0000 mg | ORAL_TABLET | Freq: Every day | ORAL | Status: DC

## 2014-03-28 ENCOUNTER — Other Ambulatory Visit: Payer: Self-pay

## 2014-03-28 MED ORDER — LISINOPRIL 40 MG PO TABS: 40.0000 mg | ORAL_TABLET | Freq: Every day | ORAL | Status: DC

## 2014-04-11 ENCOUNTER — Encounter: Payer: Self-pay | Admitting: Family Medicine

## 2014-04-11 ENCOUNTER — Ambulatory Visit (INDEPENDENT_AMBULATORY_CARE_PROVIDER_SITE_OTHER): Payer: Medicare PPO | Admitting: Family Medicine

## 2014-04-11 VITALS — BP 114/76 | HR 61 | Ht 73.0 in | Wt 244.0 lb

## 2014-04-11 DIAGNOSIS — Z5181 Encounter for therapeutic drug level monitoring: Secondary | ICD-10-CM

## 2014-04-11 DIAGNOSIS — I10 Essential (primary) hypertension: Secondary | ICD-10-CM

## 2014-04-11 DIAGNOSIS — E785 Hyperlipidemia, unspecified: Secondary | ICD-10-CM

## 2014-04-11 DIAGNOSIS — N400 Enlarged prostate without lower urinary tract symptoms: Secondary | ICD-10-CM

## 2014-04-11 DIAGNOSIS — Z79899 Other long term (current) drug therapy: Secondary | ICD-10-CM

## 2014-04-11 DIAGNOSIS — K8689 Other specified diseases of pancreas: Secondary | ICD-10-CM

## 2014-04-11 MED ORDER — PRAVASTATIN SODIUM 80 MG PO TABS: 80.0000 mg | ORAL_TABLET | Freq: Every day | ORAL | Status: DC

## 2014-04-11 MED ORDER — TAMSULOSIN HCL 0.4 MG PO CAPS: ORAL_CAPSULE | ORAL | Status: DC

## 2014-04-11 MED ORDER — PANCRELIPASE (LIP-PROT-AMYL) 20000-68000 UNITS PO CPEP: 2.0000 | ORAL_CAPSULE | Freq: Three times a day (TID) | ORAL | Status: DC

## 2014-04-11 NOTE — Progress Notes (Signed)
CC: Miguel ClamRobert G Glenn is a 73 y.o. male is here for Hypertension   Subjective: HPI:  Followup BPH: Awakening one to 2 times a night to urinate. Reports urinary urgency but no hesitancy or frequency. No accidents. Denies dysuria or urinary complaints. Continues to take Flomax one capsule daily.  Followup essential hypertension: Continues to smoke on a daily basis without known side effects or intolerance. Chest pain shortness of breath orthopnea nor peripheral edema.  Followup hyperlipidemia: Continues to take pravastatin a daily basis without right upper quadrant pain nor myalgias. No formal exercise routine. No exertional chest pain or claudication  Follow pancreatic insufficiency: He's been cutting back on Creon but still taken with every meal and has not noticed any difficulty with diarrhea or abdominal discomfort. Medication is now costing him $100 a month since he is in a donut hole   Review Of Systems Outlined In HPI  Past Medical History  Diagnosis Date  . HTN (hypertension)   . HLD (hyperlipidemia)   . Depression with anxiety   . CAD (coronary artery disease)     stents placed in [place] previously  . DJD of shoulder   . Chronic pancreatitis   . Asthma   . DDD (degenerative disc disease), lumbar   . Bladder spasm   . IFG (impaired fasting glucose)   . Cat allergies   . Colon polyps     hx  . PUD (peptic ulcer disease)     gi bleed    Past Surgical History  Procedure Laterality Date  . Appendectomy    . R shoulder replacement  12/10    Dr. Marlyne BeardsJennings  . Rtc surgery bilat    . Cholecystectomy    . Hiatal hernia repair  1991  . Abdominal exploration surgery  1990s   Family History  Problem Relation Age of Onset  . Lung cancer Mother   . Hypertension Mother   . Hyperlipidemia Mother   . Hypertension Sister     History   Social History  . Marital Status: Widowed    Spouse Name: N/A    Number of Children: N/A  . Years of Education: N/A   Occupational History  .  Not on file.   Social History Main Topics  . Smoking status: Former Games developermoker  . Smokeless tobacco: Not on file     Comment: quit 1996  . Alcohol Use: No  . Drug Use:   . Sexual Activity:    Other Topics Concern  . Not on file   Social History Narrative   [facility] at [facility] [employer]     Objective: BP 114/76  Pulse 61  Ht 6\' 1"  (1.854 m)  Wt 244 lb (110.678 kg)  BMI 32.20 kg/m2  General: Alert and Oriented, No Acute Distress HEENT: Pupils equal, round, reactive to light. Conjunctivae clear.  Moist mucous membranes pharynx unremarkable Lungs: Clear to auscultation bilaterally, no wheezing/ronchi/rales.  Comfortable work of breathing. Good air movement. Cardiac: Regular rate and rhythm. Normal S1/S2.  No murmurs, rubs, nor gallops.  No carotid bruit Abdomen: Soft mild obesity Extremities: No peripheral edema.  Strong peripheral pulses.  Mental Status: No depression, anxiety, nor agitation. Skin: Warm and dry.  Assessment & Plan: Miguel Glenn was seen today for hypertension.  Diagnoses and associated orders for this visit:  BPH (benign prostatic hyperplasia) - tamsulosin (FLOMAX) 0.4 MG CAPS capsule; TAKE ONE CAPSULE BY MOUTH ONCE DAILY  Essential hypertension, benign  Hyperlipidemia LDL goal <70 - Lipid panel  Hyperlipidemia - pravastatin (  PRAVACHOL) 80 MG tablet; Take 1 tablet (80 mg total) by mouth daily.  Encounter for monitoring statin therapy - COMPLETE METABOLIC PANEL WITH GFR  Pancreatic insufficiency - Pancrelipase, Lip-Prot-Amyl, 20000 UNITS CPEP; Take 2 capsules (40,000 Units total) by mouth 3 (three) times daily before meals. Before meals    BPH: Questionably controlled, I like him to increase his Flomax to 2 capsules daily for the next one week and if beneficial call me so I can change the prescription, if no benefit to go back to single capsule a day Essential hypertension: Controlled continue lisinopril Hyperlipidemia: Due for lipid panel continue pravastatin  pending results and liver enzymes Pancreatic insufficiency: Encouraged him to cut back on his Creon by 1 capsule daily at evening meal, if he develops diarrhea or abdominal discomfort go back to the prior dose.    Return in about 6 months (around 10/12/2014).

## 2014-04-12 LAB — COMPLETE METABOLIC PANEL WITH GFR
ALBUMIN: 4.6 g/dL (ref 3.5–5.2)
ALT: 25 U/L (ref 0–53)
AST: 21 U/L (ref 0–37)
Alkaline Phosphatase: 56 U/L (ref 39–117)
BILIRUBIN TOTAL: 0.6 mg/dL (ref 0.2–1.2)
BUN: 11 mg/dL (ref 6–23)
CALCIUM: 9.4 mg/dL (ref 8.4–10.5)
CHLORIDE: 102 meq/L (ref 96–112)
CO2: 27 meq/L (ref 19–32)
Creat: 0.92 mg/dL (ref 0.50–1.35)
GFR, EST NON AFRICAN AMERICAN: 84 mL/min
GLUCOSE: 103 mg/dL — AB (ref 70–99)
Potassium: 4.4 mEq/L (ref 3.5–5.3)
SODIUM: 139 meq/L (ref 135–145)
TOTAL PROTEIN: 6.9 g/dL (ref 6.0–8.3)

## 2014-04-12 LAB — LIPID PANEL
Cholesterol: 129 mg/dL (ref 0–200)
HDL: 45 mg/dL (ref >39)
LDL CALC: 59 mg/dL (ref 0–99)
TRIGLYCERIDES: 123 mg/dL (ref <150)
Total CHOL/HDL Ratio: 2.9 Ratio
VLDL: 25 mg/dL (ref 0–40)

## 2014-08-13 ENCOUNTER — Ambulatory Visit (INDEPENDENT_AMBULATORY_CARE_PROVIDER_SITE_OTHER): Payer: Medicare PPO

## 2014-08-13 ENCOUNTER — Encounter: Payer: Self-pay | Admitting: Physician Assistant

## 2014-08-13 ENCOUNTER — Ambulatory Visit (INDEPENDENT_AMBULATORY_CARE_PROVIDER_SITE_OTHER): Payer: Medicare PPO | Admitting: Physician Assistant

## 2014-08-13 VITALS — BP 118/77 | HR 65 | Ht 73.0 in | Wt 241.0 lb

## 2014-08-13 DIAGNOSIS — N400 Enlarged prostate without lower urinary tract symptoms: Secondary | ICD-10-CM

## 2014-08-13 DIAGNOSIS — M545 Low back pain, unspecified: Secondary | ICD-10-CM

## 2014-08-13 DIAGNOSIS — IMO0001 Reserved for inherently not codable concepts without codable children: Secondary | ICD-10-CM

## 2014-08-13 DIAGNOSIS — R030 Elevated blood-pressure reading, without diagnosis of hypertension: Secondary | ICD-10-CM

## 2014-08-13 MED ORDER — DICLOFENAC SODIUM 1 % TD GEL: 4.0000 g | Freq: Four times a day (QID) | TRANSDERMAL | Status: DC

## 2014-08-13 NOTE — Patient Instructions (Signed)
Try aleve twice a day. Given cream to try spot on over back area. Do not use aleve and cream together.   Low Back Sprain with Rehab  A sprain is an injury in which a ligament is torn. The ligaments of the lower back are vulnerable to sprains. However, they are strong and require great force to be injured. These ligaments are important for stabilizing the spinal column. Sprains are classified into three categories. Grade 1 sprains cause pain, but the tendon is not lengthened. Grade 2 sprains include a lengthened ligament, due to the ligament being stretched or partially ruptured. With grade 2 sprains there is still function, although the function may be decreased. Grade 3 sprains involve a complete tear of the tendon or muscle, and function is usually impaired. SYMPTOMS   Severe pain in the lower back.  Sometimes, a feeling of a "pop," "snap," or tear, at the time of injury.  Tenderness and sometimes swelling at the injury site.  Uncommonly, bruising (contusion) within 48 hours of injury.  Muscle spasms in the back. CAUSES  Low back sprains occur when a force is placed on the ligaments that is greater than they can handle. Common causes of injury include:  Performing a stressful act while off-balance.  Repetitive stressful activities that involve movement of the lower back.  Direct hit (trauma) to the lower back. RISK INCREASES WITH:  Contact sports (football, wrestling).  Collisions (major skiing accidents).  Sports that require throwing or lifting (baseball, weightlifting).  Sports involving twisting of the spine (gymnastics, diving, tennis, golf).  Poor strength and flexibility.  Inadequate protection.  Previous back injury or surgery (especially fusion). PREVENTION  Wear properly fitted and padded protective equipment.  Warm up and stretch properly before activity.  Allow for adequate recovery between workouts.  Maintain physical fitness:  Strength, flexibility,  and endurance.  Cardiovascular fitness.  Maintain a healthy body weight. PROGNOSIS  If treated properly, low back sprains usually heal with non-surgical treatment. The length of time for healing depends on the severity of the injury.  RELATED COMPLICATIONS   Recurring symptoms, resulting in a chronic problem.  Chronic inflammation and pain in the low back.  Delayed healing or resolution of symptoms, especially if activity is resumed too soon.  Prolonged impairment.  Unstable or arthritic joints of the low back. TREATMENT  Treatment first involves the use of ice and medicine, to reduce pain and inflammation. The use of strengthening and stretching exercises may help reduce pain with activity. These exercises may be performed at home or with a therapist. Severe injuries may require referral to a therapist for further evaluation and treatment, such as ultrasound. Your caregiver may advise that you wear a back brace or corset, to help reduce pain and discomfort. Often, prolonged bed rest results in greater harm then benefit. Corticosteroid injections may be recommended. However, these should be reserved for the most serious cases. It is important to avoid using your back when lifting objects. At night, sleep on your back on a firm mattress, with a pillow placed under your knees. If non-surgical treatment is unsuccessful, surgery may be needed.  MEDICATION   If pain medicine is needed, nonsteroidal anti-inflammatory medicines (aspirin and ibuprofen), or other minor pain relievers (acetaminophen), are often advised.  Do not take pain medicine for 7 days before surgery.  Prescription pain relievers may be given, if your caregiver thinks they are needed. Use only as directed and only as much as you need.  Ointments applied to the  skin may be helpful.  Corticosteroid injections may be given by your caregiver. These injections should be reserved for the most serious cases, because they may only  be given a certain number of times. HEAT AND COLD  Cold treatment (icing) should be applied for 10 to 15 minutes every 2 to 3 hours for inflammation and pain, and immediately after activity that aggravates your symptoms. Use ice packs or an ice massage.  Heat treatment may be used before performing stretching and strengthening activities prescribed by your caregiver, physical therapist, or athletic trainer. Use a heat pack or a warm water soak. SEEK MEDICAL CARE IF:   Symptoms get worse or do not improve in 2 to 4 weeks, despite treatment.  You develop numbness or weakness in either leg.  You lose bowel or bladder function.  Any of the following occur after surgery: fever, increased pain, swelling, redness, drainage of fluids, or bleeding in the affected area.  New, unexplained symptoms develop. (Drugs used in treatment may produce side effects.) EXERCISES  RANGE OF MOTION (ROM) AND STRETCHING EXERCISES - Low Back Sprain Most people with lower back pain will find that their symptoms get worse with excessive bending forward (flexion) or arching at the lower back (extension). The exercises that will help resolve your symptoms will focus on the opposite motion.  Your physician, physical therapist or athletic trainer will help you determine which exercises will be most helpful to resolve your lower back pain. Do not complete any exercises without first consulting with your caregiver. Discontinue any exercises which make your symptoms worse, until you speak to your caregiver. If you have pain, numbness or tingling which travels down into your buttocks, leg or foot, the goal of the therapy is for these symptoms to move closer to your back and eventually resolve. Sometimes, these leg symptoms will get better, but your lower back pain may worsen. This is often an indication of progress in your rehabilitation. Be very alert to any changes in your symptoms and the activities in which you participated in  the 24 hours prior to the change. Sharing this information with your caregiver will allow him or her to most efficiently treat your condition. These exercises may help you when beginning to rehabilitate your injury. Your symptoms may resolve with or without further involvement from your physician, physical therapist or athletic trainer. While completing these exercises, remember:   Restoring tissue flexibility helps normal motion to return to the joints. This allows healthier, less painful movement and activity.  An effective stretch should be held for at least 30 seconds.  A stretch should never be painful. You should only feel a gentle lengthening or release in the stretched tissue. FLEXION RANGE OF MOTION AND STRETCHING EXERCISES: STRETCH - Flexion, Single Knee to Chest   Lie on a firm bed or floor with both legs extended in front of you.  Keeping one leg in contact with the floor, bring your opposite knee to your chest. Hold your leg in place by either grabbing behind your thigh or at your knee.  Pull until you feel a gentle stretch in your low back. Hold __________ seconds.  Slowly release your grasp and repeat the exercise with the opposite side. Repeat __________ times. Complete this exercise __________ times per day.  STRETCH - Flexion, Double Knee to Chest  Lie on a firm bed or floor with both legs extended in front of you.  Keeping one leg in contact with the floor, bring your opposite knee to  your chest.  Tense your stomach muscles to support your back and then lift your other knee to your chest. Hold your legs in place by either grabbing behind your thighs or at your knees.  Pull both knees toward your chest until you feel a gentle stretch in your low back. Hold __________ seconds.  Tense your stomach muscles and slowly return one leg at a time to the floor. Repeat __________ times. Complete this exercise __________ times per day.  STRETCH - Low Trunk Rotation  Lie on a  firm bed or floor. Keeping your legs in front of you, bend your knees so they are both pointed toward the ceiling and your feet are flat on the floor.  Extend your arms out to the side. This will stabilize your upper body by keeping your shoulders in contact with the floor.  Gently and slowly drop both knees together to one side until you feel a gentle stretch in your low back. Hold for __________ seconds.  Tense your stomach muscles to support your lower back as you bring your knees back to the starting position. Repeat the exercise to the other side. Repeat __________ times. Complete this exercise __________ times per day  EXTENSION RANGE OF MOTION AND FLEXIBILITY EXERCISES: STRETCH - Extension, Prone on Elbows   Lie on your stomach on the floor, a bed will be too soft. Place your palms about shoulder width apart and at the height of your head.  Place your elbows under your shoulders. If this is too painful, stack pillows under your chest.  Allow your body to relax so that your hips drop lower and make contact more completely with the floor.  Hold this position for __________ seconds.  Slowly return to lying flat on the floor. Repeat __________ times. Complete this exercise __________ times per day.  RANGE OF MOTION - Extension, Prone Press Ups  Lie on your stomach on the floor, a bed will be too soft. Place your palms about shoulder width apart and at the height of your head.  Keeping your back as relaxed as possible, slowly straighten your elbows while keeping your hips on the floor. You may adjust the placement of your hands to maximize your comfort. As you gain motion, your hands will come more underneath your shoulders.  Hold this position __________ seconds.  Slowly return to lying flat on the floor. Repeat __________ times. Complete this exercise __________ times per day.  RANGE OF MOTION- Quadruped, Neutral Spine   Assume a hands and knees position on a firm surface. Keep  your hands under your shoulders and your knees under your hips. You may place padding under your knees for comfort.  Drop your head and point your tailbone toward the ground below you. This will round out your lower back like an angry cat. Hold this position for __________ seconds.  Slowly lift your head and release your tail bone so that your back sags into a large arch, like an old horse.  Hold this position for __________ seconds.  Repeat this until you feel limber in your low back.  Now, find your "sweet spot." This will be the most comfortable position somewhere between the two previous positions. This is your neutral spine. Once you have found this position, tense your stomach muscles to support your low back.  Hold this position for __________ seconds. Repeat __________ times. Complete this exercise __________ times per day.  STRENGTHENING EXERCISES - Low Back Sprain These exercises may help you when beginning  to rehabilitate your injury. These exercises should be done near your "sweet spot." This is the neutral, low-back arch, somewhere between fully rounded and fully arched, that is your least painful position. When performed in this safe range of motion, these exercises can be used for people who have either a flexion or extension based injury. These exercises may resolve your symptoms with or without further involvement from your physician, physical therapist or athletic trainer. While completing these exercises, remember:   Muscles can gain both the endurance and the strength needed for everyday activities through controlled exercises.  Complete these exercises as instructed by your physician, physical therapist or athletic trainer. Increase the resistance and repetitions only as guided.  You may experience muscle soreness or fatigue, but the pain or discomfort you are trying to eliminate should never worsen during these exercises. If this pain does worsen, stop and make certain you  are following the directions exactly. If the pain is still present after adjustments, discontinue the exercise until you can discuss the trouble with your caregiver. STRENGTHENING - Deep Abdominals, Pelvic Tilt   Lie on a firm bed or floor. Keeping your legs in front of you, bend your knees so they are both pointed toward the ceiling and your feet are flat on the floor.  Tense your lower abdominal muscles to press your low back into the floor. This motion will rotate your pelvis so that your tail bone is scooping upwards rather than pointing at your feet or into the floor. With a gentle tension and even breathing, hold this position for __________ seconds. Repeat __________ times. Complete this exercise __________ times per day.  STRENGTHENING - Abdominals, Crunches   Lie on a firm bed or floor. Keeping your legs in front of you, bend your knees so they are both pointed toward the ceiling and your feet are flat on the floor. Cross your arms over your chest.  Slightly tip your chin down without bending your neck.  Tense your abdominals and slowly lift your trunk high enough to just clear your shoulder blades. Lifting higher can put excessive stress on the lower back and does not further strengthen your abdominal muscles.  Control your return to the starting position. Repeat __________ times. Complete this exercise __________ times per day.  STRENGTHENING - Quadruped, Opposite UE/LE Lift   Assume a hands and knees position on a firm surface. Keep your hands under your shoulders and your knees under your hips. You may place padding under your knees for comfort.  Find your neutral spine and gently tense your abdominal muscles so that you can maintain this position. Your shoulders and hips should form a rectangle that is parallel with the floor and is not twisted.  Keeping your trunk steady, lift your right hand no higher than your shoulder and then your left leg no higher than your hip. Make sure  you are not holding your breath. Hold this position for __________ seconds.  Continuing to keep your abdominal muscles tense and your back steady, slowly return to your starting position. Repeat with the opposite arm and leg. Repeat __________ times. Complete this exercise __________ times per day.  STRENGTHENING - Abdominals and Quadriceps, Straight Leg Raise   Lie on a firm bed or floor with both legs extended in front of you.  Keeping one leg in contact with the floor, bend the other knee so that your foot can rest flat on the floor.  Find your neutral spine, and tense your abdominal muscles  to maintain your spinal position throughout the exercise.  Slowly lift your straight leg off the floor about 6 inches for a count of 15, making sure to not hold your breath.  Still keeping your neutral spine, slowly lower your leg all the way to the floor. Repeat this exercise with each leg __________ times. Complete this exercise __________ times per day. POSTURE AND BODY MECHANICS CONSIDERATIONS - Low Back Sprain Keeping correct posture when sitting, standing or completing your activities will reduce the stress put on different body tissues, allowing injured tissues a chance to heal and limiting painful experiences. The following are general guidelines for improved posture. Your physician or physical therapist will provide you with any instructions specific to your needs. While reading these guidelines, remember:  The exercises prescribed by your provider will help you have the flexibility and strength to maintain correct postures.  The correct posture provides the best environment for your joints to work. All of your joints have less wear and tear when properly supported by a spine with good posture. This means you will experience a healthier, less painful body.  Correct posture must be practiced with all of your activities, especially prolonged sitting and standing. Correct posture is as important  when doing repetitive low-stress activities (typing) as it is when doing a single heavy-load activity (lifting). RESTING POSITIONS Consider which positions are most painful for you when choosing a resting position. If you have pain with flexion-based activities (sitting, bending, stooping, squatting), choose a position that allows you to rest in a less flexed posture. You would want to avoid curling into a fetal position on your side. If your pain worsens with extension-based activities (prolonged standing, working overhead), avoid resting in an extended position such as sleeping on your stomach. Most people will find more comfort when they rest with their spine in a more neutral position, neither too rounded nor too arched. Lying on a non-sagging bed on your side with a pillow between your knees, or on your back with a pillow under your knees will often provide some relief. Keep in mind, being in any one position for a prolonged period of time, no matter how correct your posture, can still lead to stiffness. PROPER SITTING POSTURE In order to minimize stress and discomfort on your spine, you must sit with correct posture. Sitting with good posture should be effortless for a healthy body. Returning to good posture is a gradual process. Many people can work toward this most comfortably by using various supports until they have the flexibility and strength to maintain this posture on their own. When sitting with proper posture, your ears will fall over your shoulders and your shoulders will fall over your hips. You should use the back of the chair to support your upper back. Your lower back will be in a neutral position, just slightly arched. You may place a small pillow or folded towel at the base of your lower back for  support.  When working at a desk, create an environment that supports good, upright posture. Without extra support, muscles tire, which leads to excessive strain on joints and other tissues.  Keep these recommendations in mind: CHAIR:  A chair should be able to slide under your desk when your back makes contact with the back of the chair. This allows you to work closely.  The chair's height should allow your eyes to be level with the upper part of your monitor and your hands to be slightly lower than your elbows.  BODY POSITION  Your feet should make contact with the floor. If this is not possible, use a foot rest.  Keep your ears over your shoulders. This will reduce stress on your neck and low back. INCORRECT SITTING POSTURES  If you are feeling tired and unable to assume a healthy sitting posture, do not slouch or slump. This puts excessive strain on your back tissues, causing more damage and pain. Healthier options include:  Using more support, like a lumbar pillow.  Switching tasks to something that requires you to be upright or walking.  Talking a brief walk.  Lying down to rest in a neutral-spine position. PROLONGED STANDING WHILE SLIGHTLY LEANING FORWARD  When completing a task that requires you to lean forward while standing in one place for a long time, place either foot up on a stationary 2-4 inch high object to help maintain the best posture. When both feet are on the ground, the lower back tends to lose its slight inward curve. If this curve flattens (or becomes too large), then the back and your other joints will experience too much stress, tire more quickly, and can cause pain. CORRECT STANDING POSTURES Proper standing posture should be assumed with all daily activities, even if they only take a few moments, like when brushing your teeth. As in sitting, your ears should fall over your shoulders and your shoulders should fall over your hips. You should keep a slight tension in your abdominal muscles to brace your spine. Your tailbone should point down to the ground, not behind your body, resulting in an over-extended swayback posture.  INCORRECT STANDING POSTURES    Common incorrect standing postures include a forward head, locked knees and/or an excessive swayback. WALKING Walk with an upright posture. Your ears, shoulders and hips should all line-up. PROLONGED ACTIVITY IN A FLEXED POSITION When completing a task that requires you to bend forward at your waist or lean over a low surface, try to find a way to stabilize 3 out of 4 of your limbs. You can place a hand or elbow on your thigh or rest a knee on the surface you are reaching across. This will provide you more stability, so that your muscles do not tire as quickly. By keeping your knees relaxed, or slightly bent, you will also reduce stress across your lower back. CORRECT LIFTING TECHNIQUES DO :  Assume a wide stance. This will provide you more stability and the opportunity to get as close as possible to the object which you are lifting.  Tense your abdominals to brace your spine. Bend at the knees and hips. Keeping your back locked in a neutral-spine position, lift using your leg muscles. Lift with your legs, keeping your back straight.  Test the weight of unknown objects before attempting to lift them.  Try to keep your elbows locked down at your sides in order get the best strength from your shoulders when carrying an object.  Always ask for help when lifting heavy or awkward objects. INCORRECT LIFTING TECHNIQUES DO NOT:   Lock your knees when lifting, even if it is a small object.  Bend and twist. Pivot at your feet or move your feet when needing to change directions.  Assume that you can safely pick up even a paperclip without proper posture. Document Released: 08/22/2005 Document Revised: 11/14/2011 Document Reviewed: 12/04/2008 [facility] Patient Information 2015 [place], [place]. This information is not intended to replace advice given to you by your health care provider. Make sure you discuss any  questions you have with your health care provider.  

## 2014-08-13 NOTE — Progress Notes (Signed)
   Subjective:    Patient ID: Miguel Glenn, male    DOB: 10/31/1940, 73 y.o.   MRN: [id number]  HPI   Patient is a 73 year old male who presents to the clinic with his wife to discuss lower back pain. Patient feels like this is the same pain he had years ago when his "prostate flared up". He does admit to some clear penile leakage once or twice a day. There is no pain with urination. He does urinate rather frequent during the day but at night he is still consistent with once or twice. Admits weak stream has worsened. Would like PSA checked. He admits he never increased his flomax at last visit when Dr. Ivan Anchorshommel mentioned that he might have more control. 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 If he rest tends to improve. Not taking anything to help with pain currently. No bowel or bladder dysfunction. No saddle anthesia. Pt does wake up and feel uncomfortable some nights but no pain. He does have some recent numbness of left upper thigh off and on. No trauma or injury.    Elevated BP today. Per pt takes at home and in the 120's over 80's. No CP, palpitations, headaches or vision changes.       Review of Systems  All other systems reviewed and are negative.      Objective:   Physical Exam  Constitutional: He is oriented to person, place, and time. He appears well-developed and well-nourished.  HENT:  Head: Normocephalic and atraumatic.  Cardiovascular: Normal rate, regular rhythm and normal heart sounds.   Pulmonary/Chest: Effort normal and breath sounds normal.  Musculoskeletal:  Pain over lower lumbar spine to palpation. Pain to palpation over SI joints, bilaterally.  Negative straight leg test bilaterally.  Negative stork.  Good ROM without pain at waist.  Strength 5/5 bilateral lower extremity.  Patellar reflexes 2+, bilaterally.    Neurological: He is alert and oriented to person, place, and time.  Skin: Skin is dry.  Psychiatric:  He has a normal mood and affect. His behavior is normal.          Assessment & Plan:  Low back pain without scatica/BPH-  Unclear etiology today. Will check CBC, PSA. I am not sure if prostate is causing back pain but certainly his symptoms do not seem to be controlled. Encouraged pt to increase to .8mg  of flomax. Discussed xray. Discussed I did not think we needed to get imaging today. Wife very concerned and wanted imaging. Order lumbar spine xray. Discussed trying NSAID for inflammation such as aleve. Pt does not like to take medications so sent voltaren gel to try up to 4x a day. Gave low back exercises to try twice a day consider heat when back pain starts. Follow up with Dr. Ivan AnchorsHommel in 2 weeks.   Elevated BP- took BP at end of visit and great.   Spent 30 minutes with patient and greater than 50 percent of visit spent counseling pt on treatment plan.

## 2014-08-14 LAB — CBC WITH DIFFERENTIAL/PLATELET
BASOS PCT: 0 % (ref 0–1)
Basophils Absolute: 0 10*3/uL (ref 0.0–0.1)
Eosinophils Absolute: 0.1 10*3/uL (ref 0.0–0.7)
Eosinophils Relative: 2 % (ref 0–5)
HEMATOCRIT: 47 % (ref 39.0–52.0)
HEMOGLOBIN: 15.7 g/dL (ref 13.0–17.0)
LYMPHS ABS: 1.2 10*3/uL (ref 0.7–4.0)
LYMPHS PCT: 29 % (ref 12–46)
MCH: 30 pg (ref 26.0–34.0)
MCHC: 33.4 g/dL (ref 30.0–36.0)
MCV: 89.7 fL (ref 78.0–100.0)
MPV: 10.2 fL (ref 9.4–12.4)
Monocytes Absolute: 0.3 10*3/uL (ref 0.1–1.0)
Monocytes Relative: 7 % (ref 3–12)
NEUTROS PCT: 62 % (ref 43–77)
Neutro Abs: 2.7 10*3/uL (ref 1.7–7.7)
Platelets: 216 10*3/uL (ref 150–400)
RBC: 5.24 MIL/uL (ref 4.22–5.81)
RDW: 13.6 % (ref 11.5–15.5)
WBC: 4.3 10*3/uL (ref 4.0–10.5)

## 2014-08-14 LAB — PSA: PSA: 2.03 ng/mL (ref <=4.00)

## 2014-08-15 ENCOUNTER — Other Ambulatory Visit: Payer: Self-pay | Admitting: *Deleted

## 2014-08-15 MED ORDER — TRAMADOL HCL 50 MG PO TABS: 50.0000 mg | ORAL_TABLET | Freq: Three times a day (TID) | ORAL | Status: DC | PRN

## 2014-08-26 ENCOUNTER — Ambulatory Visit (INDEPENDENT_AMBULATORY_CARE_PROVIDER_SITE_OTHER): Payer: Medicare PPO | Admitting: Family Medicine

## 2014-08-26 ENCOUNTER — Encounter: Payer: Self-pay | Admitting: Family Medicine

## 2014-08-26 VITALS — BP 138/79 | HR 60 | Ht 77.0 in | Wt 229.0 lb

## 2014-08-26 DIAGNOSIS — I2510 Atherosclerotic heart disease of native coronary artery without angina pectoris: Secondary | ICD-10-CM

## 2014-08-26 DIAGNOSIS — N400 Enlarged prostate without lower urinary tract symptoms: Secondary | ICD-10-CM

## 2014-08-26 MED ORDER — TAMSULOSIN HCL 0.4 MG PO CAPS: ORAL_CAPSULE | ORAL | Status: DC

## 2014-08-26 NOTE — Progress Notes (Signed)
CC: Miguel Glenn is a 73 y.o. male is here for Follow-up   Subjective: HPI:  Patient [place] a form from [place] [facility]  [place] that needs to be completed. A similar one was filled out last year and it appears that this is due to his history of coronary artery disease. He confirms that he has a stent in his heart and for the last year has not had any chest pain. He does not have a defibrillator and does not use a pacemaker. He is able climb a flight of stairs and do heavy housework and loading his truck without any shortness of breath or chest discomfort.  Follow-up BPH: Since I saw him last he has not increased his Flomax. He reports moderate urinary urgency that happens on a daily basis. He often has small urinary accidents when urgency hits him suddenly and is unable to make it to the bathroom. He denies any fecal incontinence. He denies any discharge other than above nor any dysuria. PSA was checked recently and was below 4.  He states his low back pain from 2 weeks ago was almost gone now. Improvement with Aleve  Review Of Systems Outlined In HPI  Past Medical History  Diagnosis Date  . HTN (hypertension)   . HLD (hyperlipidemia)   . Depression with anxiety   . CAD (coronary artery disease)     stents placed in [place] previously  . DJD of shoulder   . Chronic pancreatitis   . Asthma   . DDD (degenerative disc disease), lumbar   . Bladder spasm   . IFG (impaired fasting glucose)   . Cat allergies   . Colon polyps     hx  . PUD (peptic ulcer disease)     gi bleed    Past Surgical History  Procedure Laterality Date  . Appendectomy    . R shoulder replacement  12/10    Dr. Marlyne BeardsJennings  . Rtc surgery bilat    . Cholecystectomy    . Hiatal hernia repair  1991  . Abdominal exploration surgery  1990s   Family History  Problem Relation Age of Onset  . Lung cancer Mother   . Hypertension Mother   . Hyperlipidemia Mother   . Hypertension Sister     History    Social History  . Marital Status: Widowed    Spouse Name: N/A    Number of Children: N/A  . Years of Education: N/A   Occupational History  . Not on file.   Social History Main Topics  . Smoking status: Former Games developermoker  . Smokeless tobacco: Not on file     Comment: quit 1996  . Alcohol Use: No  . Drug Use: Not on file  . Sexual Activity: Not on file   Other Topics Concern  . Not on file   Social History Narrative   [facility]     Objective: BP 138/79 mmHg  Pulse 60  Ht 6\' 5"  (1.956 m)  Wt 229 lb (103.874 kg)  BMI 27.15 kg/m2  Vital signs reviewed. General: Alert and Oriented, No Acute Distress HEENT: Pupils equal, round, reactive to light. Conjunctivae clear.  External ears unremarkable.  Moist mucous membranes. Lungs: Clear and comfortable work of breathing, speaking in full sentences without accessory muscle use. Cardiac: Regular rate and rhythm.  Neuro: CN II-XII grossly intact, gait normal. Extremities: No peripheral edema.  Strong peripheral pulses.  Mental Status: No depression, anxiety, nor agitation. Logical though process.  Skin: Warm and dry.  Assessment & Plan: Miguel MaduroRobert was seen today for follow-up.  Diagnoses and associated orders for this visit:  Coronary artery disease involving native coronary artery of native heart without angina pectoris  BPH (benign prostatic hyperplasia) - tamsulosin (FLOMAX) 0.4 MG CAPS capsule; TAKE Two CAPSULE BY MOUTH ONCE DAILY    Time was taken to complete his [place] of transportation form. I see no need to restrict him from driving from a medical or psychological standpoint. I have advised him to follow-up with his ophthalmologist to get clearance from him as well. BPH: Uncontrolled increasing Flomax. If this does not provide any benefit after 1 week Will admit Myrbetric  25 minutes spent face-to-face during visit today of which at least 50% was counseling or coordinating care  regarding: 1. Coronary artery disease involving native coronary artery of native heart without angina pectoris   2. BPH (benign prostatic hyperplasia)      Return if symptoms worsen or fail to improve.

## 2015-02-04 ENCOUNTER — Other Ambulatory Visit: Payer: Self-pay | Admitting: Family Medicine

## 2015-04-17 DIAGNOSIS — F329 Major depressive disorder, single episode, unspecified: Secondary | ICD-10-CM | POA: Diagnosis not present

## 2015-04-17 DIAGNOSIS — R51 Headache: Secondary | ICD-10-CM | POA: Diagnosis not present

## 2015-04-17 DIAGNOSIS — Z7982 Long term (current) use of aspirin: Secondary | ICD-10-CM | POA: Diagnosis not present

## 2015-04-17 DIAGNOSIS — R0781 Pleurodynia: Secondary | ICD-10-CM | POA: Diagnosis not present

## 2015-04-17 DIAGNOSIS — S299XXA Unspecified injury of thorax, initial encounter: Secondary | ICD-10-CM | POA: Diagnosis not present

## 2015-04-17 DIAGNOSIS — I10 Essential (primary) hypertension: Secondary | ICD-10-CM | POA: Diagnosis not present

## 2015-04-17 DIAGNOSIS — R42 Dizziness and giddiness: Secondary | ICD-10-CM | POA: Diagnosis not present

## 2015-04-17 DIAGNOSIS — Z87891 Personal history of nicotine dependence: Secondary | ICD-10-CM | POA: Diagnosis not present

## 2015-04-17 DIAGNOSIS — Z79899 Other long term (current) drug therapy: Secondary | ICD-10-CM | POA: Diagnosis not present

## 2015-04-20 DIAGNOSIS — F322 Major depressive disorder, single episode, severe without psychotic features: Secondary | ICD-10-CM | POA: Diagnosis not present

## 2015-04-22 ENCOUNTER — Encounter: Payer: Self-pay | Admitting: Family Medicine

## 2015-04-22 ENCOUNTER — Ambulatory Visit (INDEPENDENT_AMBULATORY_CARE_PROVIDER_SITE_OTHER): Payer: Medicare PPO | Admitting: Family Medicine

## 2015-04-22 ENCOUNTER — Other Ambulatory Visit: Payer: Self-pay | Admitting: Family Medicine

## 2015-04-22 VITALS — BP 131/72 | HR 55 | Wt 258.0 lb

## 2015-04-22 DIAGNOSIS — I10 Essential (primary) hypertension: Secondary | ICD-10-CM | POA: Diagnosis not present

## 2015-04-22 DIAGNOSIS — R739 Hyperglycemia, unspecified: Secondary | ICD-10-CM | POA: Diagnosis not present

## 2015-04-22 DIAGNOSIS — N400 Enlarged prostate without lower urinary tract symptoms: Secondary | ICD-10-CM

## 2015-04-22 DIAGNOSIS — E785 Hyperlipidemia, unspecified: Secondary | ICD-10-CM

## 2015-04-22 DIAGNOSIS — R7309 Other abnormal glucose: Secondary | ICD-10-CM | POA: Diagnosis not present

## 2015-04-22 LAB — LIPID PANEL
CHOLESTEROL: 111 mg/dL — AB (ref 125–200)
HDL: 35 mg/dL — ABNORMAL LOW (ref >=40)
LDL Cholesterol: 47 mg/dL (ref <130)
TRIGLYCERIDES: 144 mg/dL (ref <150)
Total CHOL/HDL Ratio: 3.2 Ratio (ref <=5.0)
VLDL: 29 mg/dL (ref <30)

## 2015-04-22 LAB — HEMOGLOBIN A1C
Hgb A1c MFr Bld: 5.9 % — ABNORMAL HIGH (ref <5.7)
MEAN PLASMA GLUCOSE: 123 mg/dL — AB (ref <117)

## 2015-04-22 MED ORDER — PRAVASTATIN SODIUM 80 MG PO TABS: 80.0000 mg | ORAL_TABLET | Freq: Every day | ORAL | Status: DC

## 2015-04-22 MED ORDER — TAMSULOSIN HCL 0.4 MG PO CAPS: ORAL_CAPSULE | ORAL | Status: DC

## 2015-04-22 MED ORDER — LISINOPRIL 40 MG PO TABS: 40.0000 mg | ORAL_TABLET | Freq: Every day | ORAL | Status: DC

## 2015-04-22 NOTE — Progress Notes (Signed)
CC: Miguel Glenn is a 74 y.o. male is here for headaches   Subjective: HPI:  Emergency room follow-up for blurred vision and double vision. For the past 2-3 months he's been having headaches behind the eyes and double vision with blurred vision in both eyes. Symptoms are worse first thing in the morning. Nothing seems to make them better now but they did improve with ibuprofen in the past. Symptoms are occurring on a daily basis and were bad enough for him to seek emergency health last week. He had a CT scan of the head which was unremarkable and blood work that showed a moderately elevated blood glucose. Symptoms have not gotten better or worse since this visit. He admits to drinking 2-3 L of Pepsi on a daily basis. No formal exercise routine. He denies any other motor or sensory disturbances recently or remotely. He denies any chest pain, irregular heartbeat, shortness of breath, confusion, disorientation, nor head trauma.   Review Of Systems Outlined In HPI  Past Medical History  Diagnosis Date  . HTN (hypertension)   . HLD (hyperlipidemia)   . Depression with anxiety   . CAD (coronary artery disease)     stents placed in [place] previously  . DJD of shoulder   . Chronic pancreatitis   . Asthma   . DDD (degenerative disc disease), lumbar   . Bladder spasm   . IFG (impaired fasting glucose)   . Cat allergies   . Colon polyps     hx  . PUD (peptic ulcer disease)     gi bleed    Past Surgical History  Procedure Laterality Date  . Appendectomy    . R shoulder replacement  12/10    Dr. Marlyne Glenn  . Rtc surgery bilat    . Cholecystectomy    . Hiatal hernia repair  1991  . Abdominal exploration surgery  1990s   Family History  Problem Relation Age of Onset  . Lung cancer Mother   . Hypertension Mother   . Hyperlipidemia Mother   . Hypertension Sister     Social History   Social History  . Marital Status: Widowed    Spouse Name: N/A  . Number of Children: N/A  . Years of  Education: N/A   Occupational History  . Not on file.   Social History Main Topics  . Smoking status: Former Games developer  . Smokeless tobacco: Not on file     Comment: quit 1996  . Alcohol Use: No  . Drug Use: Not on file  . Sexual Activity: Not on file   Other Topics Concern  . Not on file   Social History Narrative   [facility] [employer]     Objective: BP 131/72 mmHg  Pulse 55  Wt 258 lb (117.028 kg)  General: Alert and Oriented, No Acute Distress HEENT: Pupils equal, round, reactive to light. Conjunctivae clear.  Moist mucous membranes Neuro: CN II-XII grossly intact, full strength/rom of all four extremities, C5/L4/S1 DTRs 2/4 bilaterally, gait normal, rapid alternating movements normal, heel-shin test normal, Rhomberg normal. Lungs: Clear to auscultation bilaterally, no wheezing/ronchi/rales.  Comfortable work of breathing. Good air movement. Cardiac: Regular rate and rhythm. Normal S1/S2.  No murmurs, rubs, nor gallops.  No carotid bruit Extremities: No peripheral edema.  Strong peripheral pulses.  Mental Status: No depression, anxiety, nor agitation. Skin: Warm and dry.  Assessment & Plan: Miguel Glenn was seen today for headaches.  Diagnoses and all orders for this visit:  BPH (  benign prostatic hyperplasia) -     tamsulosin (FLOMAX) 0.4 MG CAPS capsule; TAKE Two CAPSULE BY MOUTH ONCE DAILY  Essential hypertension, benign -     lisinopril (PRINIVIL,ZESTRIL) 40 MG tablet; Take 1 tablet (40 mg total) by mouth daily.  Hyperlipidemia LDL goal <70 -     pravastatin (PRAVACHOL) 80 MG tablet; Take 1 tablet (80 mg total) by mouth daily. -     Lipid panel  Hyperglycemia -     Hemoglobin A1c   He's requesting refills on Flomax, BPH is controlled without any nocturnal symptoms Essential hypertension: Requesting refills on lisinopril, blood pressure is controlled today Hyperlipidemia: Requesting refills on pravastatin, clinically controlled without myalgias, due for repeat  lipid panel His history of hyperglycemia and Pepsi consumption Mixed type 2 diabetes high on the differential for the cause of his blurred vision and double vision. Checking an A1c today, if this is normal the next step would be an MRI of the brain.  Return in about 3 months (around 07/23/2015).

## 2015-04-23 ENCOUNTER — Telehealth: Payer: Self-pay | Admitting: Family Medicine

## 2015-04-23 DIAGNOSIS — H538 Other visual disturbances: Secondary | ICD-10-CM

## 2015-04-23 DIAGNOSIS — H532 Diplopia: Secondary | ICD-10-CM

## 2015-04-23 LAB — COMPREHENSIVE METABOLIC PANEL
ALK PHOS: 59 U/L (ref 40–115)
ALT: 25 U/L (ref 9–46)
AST: 18 U/L (ref 10–35)
Albumin: 4.1 g/dL (ref 3.6–5.1)
BILIRUBIN TOTAL: 0.4 mg/dL (ref 0.2–1.2)
BUN: 16 mg/dL (ref 7–25)
CALCIUM: 8.9 mg/dL (ref 8.6–10.3)
CO2: 30 mmol/L (ref 20–31)
Chloride: 104 mmol/L (ref 98–110)
Creat: 1.11 mg/dL (ref 0.70–1.18)
Glucose, Bld: 90 mg/dL (ref 65–99)
POTASSIUM: 4.8 mmol/L (ref 3.5–5.3)
Sodium: 142 mmol/L (ref 135–146)
TOTAL PROTEIN: 6.4 g/dL (ref 6.1–8.1)

## 2015-04-23 NOTE — Telephone Encounter (Signed)
Pt notified of MRI status and of lab results

## 2015-04-23 NOTE — Telephone Encounter (Signed)
Have not spoken to Pt, but did call [facility] and add on an order for CMP. These levels need to be check within 30 days before an MRI with contrast can be preformed. Will work on Imaging authorization, Pt will have to complete imaging at [facility] due to age/insurance.

## 2015-04-23 NOTE — Telephone Encounter (Signed)
Sue Lush, Will you please let patient know that his 3 month average blood sugar confirmed that it was above normal however it was only slightly elevated.  This could still be the cause of his vision disturbance but I'd still recommend getting the MRI of the brain to make sure there is nothing more serious going on.  An order has been placed for this.

## 2015-04-24 DIAGNOSIS — H2512 Age-related nuclear cataract, left eye: Secondary | ICD-10-CM | POA: Diagnosis not present

## 2015-05-02 ENCOUNTER — Ambulatory Visit (HOSPITAL_BASED_OUTPATIENT_CLINIC_OR_DEPARTMENT_OTHER)
Admission: RE | Admit: 2015-05-02 | Discharge: 2015-05-02 | Disposition: A | Discharge status: Home / Self Care | Payer: Medicare PPO | Source: Ambulatory Visit | Attending: Family Medicine | Admitting: Family Medicine

## 2015-05-02 DIAGNOSIS — R410 Disorientation, unspecified: Secondary | ICD-10-CM | POA: Insufficient documentation

## 2015-05-02 DIAGNOSIS — R51 Headache: Secondary | ICD-10-CM | POA: Diagnosis not present

## 2015-05-02 DIAGNOSIS — R413 Other amnesia: Secondary | ICD-10-CM | POA: Diagnosis not present

## 2015-05-02 DIAGNOSIS — H532 Diplopia: Secondary | ICD-10-CM

## 2015-05-02 DIAGNOSIS — I998 Other disorder of circulatory system: Secondary | ICD-10-CM | POA: Diagnosis not present

## 2015-05-02 DIAGNOSIS — G319 Degenerative disease of nervous system, unspecified: Secondary | ICD-10-CM | POA: Insufficient documentation

## 2015-05-02 DIAGNOSIS — H538 Other visual disturbances: Secondary | ICD-10-CM | POA: Insufficient documentation

## 2015-05-04 ENCOUNTER — Telehealth: Payer: Self-pay | Admitting: Family Medicine

## 2015-05-04 DIAGNOSIS — R519 Headache, unspecified: Secondary | ICD-10-CM

## 2015-05-04 DIAGNOSIS — H538 Other visual disturbances: Secondary | ICD-10-CM

## 2015-05-04 DIAGNOSIS — R51 Headache: Secondary | ICD-10-CM

## 2015-05-04 NOTE — Telephone Encounter (Signed)
Miguel Glenn, Will you please let Miguel Glenn know that his MRI did not show any abnormality to account for his headache and blurred vision.  This is very reassuring.  There was a benign cyst seen however the radiologist feels that this will not ever cause any symptoms or problems.  For further evaluation I'll place a referral to a neurologist.

## 2015-05-04 NOTE — Telephone Encounter (Signed)
Pt notified and pt already has a neurologist

## 2015-05-06 ENCOUNTER — Telehealth: Payer: Self-pay | Admitting: *Deleted

## 2015-05-06 NOTE — Telephone Encounter (Signed)
Pt wants MRI faxed to [facility]. He has an appt tomorrow. MRI faxed through [facility]

## 2015-05-07 DIAGNOSIS — M5481 Occipital neuralgia: Secondary | ICD-10-CM | POA: Diagnosis not present

## 2015-05-07 DIAGNOSIS — G609 Hereditary and idiopathic neuropathy, unspecified: Secondary | ICD-10-CM | POA: Diagnosis not present

## 2015-05-07 DIAGNOSIS — G43719 Chronic migraine without aura, intractable, without status migrainosus: Secondary | ICD-10-CM | POA: Diagnosis not present

## 2015-05-07 DIAGNOSIS — G459 Transient cerebral ischemic attack, unspecified: Secondary | ICD-10-CM | POA: Diagnosis not present

## 2015-05-07 DIAGNOSIS — E531 Pyridoxine deficiency: Secondary | ICD-10-CM | POA: Diagnosis not present

## 2015-05-07 DIAGNOSIS — E538 Deficiency of other specified B group vitamins: Secondary | ICD-10-CM | POA: Diagnosis not present

## 2015-05-07 DIAGNOSIS — G729 Myopathy, unspecified: Secondary | ICD-10-CM | POA: Diagnosis not present

## 2015-05-07 DIAGNOSIS — E559 Vitamin D deficiency, unspecified: Secondary | ICD-10-CM | POA: Diagnosis not present

## 2015-05-07 DIAGNOSIS — R2681 Unsteadiness on feet: Secondary | ICD-10-CM | POA: Diagnosis not present

## 2015-05-07 DIAGNOSIS — G603 Idiopathic progressive neuropathy: Secondary | ICD-10-CM | POA: Diagnosis not present

## 2015-05-07 DIAGNOSIS — E119 Type 2 diabetes mellitus without complications: Secondary | ICD-10-CM | POA: Diagnosis not present

## 2015-05-13 ENCOUNTER — Encounter: Payer: Self-pay | Admitting: Family Medicine

## 2015-05-13 DIAGNOSIS — M5481 Occipital neuralgia: Secondary | ICD-10-CM | POA: Insufficient documentation

## 2015-05-15 DIAGNOSIS — G603 Idiopathic progressive neuropathy: Secondary | ICD-10-CM | POA: Diagnosis not present

## 2015-05-15 DIAGNOSIS — M5481 Occipital neuralgia: Secondary | ICD-10-CM | POA: Diagnosis not present

## 2015-05-15 DIAGNOSIS — M5417 Radiculopathy, lumbosacral region: Secondary | ICD-10-CM | POA: Diagnosis not present

## 2015-05-15 DIAGNOSIS — G43719 Chronic migraine without aura, intractable, without status migrainosus: Secondary | ICD-10-CM | POA: Diagnosis not present

## 2015-05-15 DIAGNOSIS — R2681 Unsteadiness on feet: Secondary | ICD-10-CM | POA: Diagnosis not present

## 2015-05-22 ENCOUNTER — Encounter: Payer: Self-pay | Admitting: Family Medicine

## 2015-05-22 DIAGNOSIS — M5416 Radiculopathy, lumbar region: Secondary | ICD-10-CM | POA: Insufficient documentation

## 2015-06-02 DIAGNOSIS — G43719 Chronic migraine without aura, intractable, without status migrainosus: Secondary | ICD-10-CM | POA: Diagnosis not present

## 2015-06-02 DIAGNOSIS — R2681 Unsteadiness on feet: Secondary | ICD-10-CM | POA: Diagnosis not present

## 2015-06-02 DIAGNOSIS — G603 Idiopathic progressive neuropathy: Secondary | ICD-10-CM | POA: Diagnosis not present

## 2015-06-02 DIAGNOSIS — M5481 Occipital neuralgia: Secondary | ICD-10-CM | POA: Diagnosis not present

## 2015-06-04 ENCOUNTER — Encounter: Payer: Self-pay | Admitting: Family Medicine

## 2015-06-04 DIAGNOSIS — G609 Hereditary and idiopathic neuropathy, unspecified: Secondary | ICD-10-CM | POA: Insufficient documentation

## 2015-07-15 DIAGNOSIS — Z961 Presence of intraocular lens: Secondary | ICD-10-CM | POA: Diagnosis not present

## 2015-07-15 DIAGNOSIS — H527 Unspecified disorder of refraction: Secondary | ICD-10-CM | POA: Diagnosis not present

## 2015-07-15 DIAGNOSIS — H25812 Combined forms of age-related cataract, left eye: Secondary | ICD-10-CM | POA: Diagnosis not present

## 2015-07-28 DIAGNOSIS — F329 Major depressive disorder, single episode, unspecified: Secondary | ICD-10-CM | POA: Diagnosis not present

## 2015-07-28 DIAGNOSIS — Z87891 Personal history of nicotine dependence: Secondary | ICD-10-CM | POA: Diagnosis not present

## 2015-07-28 DIAGNOSIS — I2510 Atherosclerotic heart disease of native coronary artery without angina pectoris: Secondary | ICD-10-CM | POA: Diagnosis not present

## 2015-07-28 DIAGNOSIS — Z9841 Cataract extraction status, right eye: Secondary | ICD-10-CM | POA: Diagnosis not present

## 2015-07-28 DIAGNOSIS — I10 Essential (primary) hypertension: Secondary | ICD-10-CM | POA: Diagnosis not present

## 2015-07-28 DIAGNOSIS — H2512 Age-related nuclear cataract, left eye: Secondary | ICD-10-CM | POA: Diagnosis not present

## 2015-07-28 DIAGNOSIS — H25812 Combined forms of age-related cataract, left eye: Secondary | ICD-10-CM | POA: Diagnosis not present

## 2015-07-28 DIAGNOSIS — Z961 Presence of intraocular lens: Secondary | ICD-10-CM | POA: Diagnosis not present

## 2015-07-28 DIAGNOSIS — N400 Enlarged prostate without lower urinary tract symptoms: Secondary | ICD-10-CM | POA: Diagnosis not present

## 2015-07-28 DIAGNOSIS — Z955 Presence of coronary angioplasty implant and graft: Secondary | ICD-10-CM | POA: Diagnosis not present

## 2015-08-17 DIAGNOSIS — F322 Major depressive disorder, single episode, severe without psychotic features: Secondary | ICD-10-CM | POA: Diagnosis not present

## 2015-10-07 ENCOUNTER — Other Ambulatory Visit: Payer: Self-pay | Admitting: Family Medicine

## 2015-10-09 ENCOUNTER — Encounter: Payer: Medicare PPO | Admitting: Family Medicine

## 2015-11-17 ENCOUNTER — Encounter: Payer: Medicare PPO | Admitting: Family Medicine

## 2015-11-18 ENCOUNTER — Encounter: Payer: Self-pay | Admitting: Family Medicine

## 2015-11-18 ENCOUNTER — Ambulatory Visit (INDEPENDENT_AMBULATORY_CARE_PROVIDER_SITE_OTHER): Payer: Medicare PPO | Admitting: Family Medicine

## 2015-11-18 DIAGNOSIS — Z0289 Encounter for other administrative examinations: Secondary | ICD-10-CM

## 2015-11-18 NOTE — Progress Notes (Signed)
Over 10 minutes late for today's visit, still arrived since he was stuck in traffic so I will not charge him a no show fee.

## 2015-11-19 ENCOUNTER — Telehealth: Payer: Self-pay | Admitting: Family Medicine

## 2015-11-19 ENCOUNTER — Ambulatory Visit (INDEPENDENT_AMBULATORY_CARE_PROVIDER_SITE_OTHER): Payer: Medicare PPO | Admitting: Sports Medicine

## 2015-11-19 ENCOUNTER — Encounter: Payer: Self-pay | Admitting: Sports Medicine

## 2015-11-19 VITALS — BP 121/66 | HR 60 | Resp 18 | Ht 75.5 in | Wt 242.6 lb

## 2015-11-19 DIAGNOSIS — Z1211 Encounter for screening for malignant neoplasm of colon: Secondary | ICD-10-CM | POA: Diagnosis not present

## 2015-11-19 DIAGNOSIS — R011 Cardiac murmur, unspecified: Secondary | ICD-10-CM

## 2015-11-19 DIAGNOSIS — I10 Essential (primary) hypertension: Secondary | ICD-10-CM

## 2015-11-19 DIAGNOSIS — N400 Enlarged prostate without lower urinary tract symptoms: Secondary | ICD-10-CM | POA: Diagnosis not present

## 2015-11-19 DIAGNOSIS — Z0000 Encounter for general adult medical examination without abnormal findings: Secondary | ICD-10-CM

## 2015-11-19 DIAGNOSIS — I2510 Atherosclerotic heart disease of native coronary artery without angina pectoris: Secondary | ICD-10-CM

## 2015-11-19 DIAGNOSIS — F32A Depression, unspecified: Secondary | ICD-10-CM

## 2015-11-19 DIAGNOSIS — G479 Sleep disorder, unspecified: Secondary | ICD-10-CM

## 2015-11-19 DIAGNOSIS — F329 Major depressive disorder, single episode, unspecified: Secondary | ICD-10-CM

## 2015-11-19 LAB — HEMOCCULT GUIAC POC 1CARD (OFFICE): Fecal Occult Blood, POC: NEGATIVE

## 2015-11-19 MED ORDER — MIRABEGRON ER 50 MG PO TB24: 50.0000 mg | ORAL_TABLET | Freq: Every day | ORAL | Status: DC

## 2015-11-19 MED ORDER — LISINOPRIL 40 MG PO TABS: 40.0000 mg | ORAL_TABLET | Freq: Every day | ORAL | Status: DC

## 2015-11-19 MED ORDER — ASPIRIN EC 81 MG PO TBEC: 81.0000 mg | DELAYED_RELEASE_TABLET | Freq: Every day | ORAL | Status: DC

## 2015-11-19 NOTE — Addendum Note (Signed)
Addended by: Baird KayUGLAS, Jeromy Borcherding M on: 11/19/2015 02:57 PM   Modules accepted: Orders

## 2015-11-19 NOTE — Progress Notes (Signed)
Subjective:    Miguel Glenn is a 75 y.o. male who presents for Medicare Annual/Subsequent preventive examination.   Preventive Screening-Counseling & Management  Tobacco History  Smoking status  . Former Smoker  Smokeless tobacco  . Not on file    Comment: quit 1996    Problems Prior to Visit 1. Bladder: Has noted random urges to void, sometimes before he can get to the bathroom he will leak. Denies any symptoms of hesitancy obstruction, tells me that Flomax has not improved his symptoms very much.  Current Problems (verified) Patient Active Problem List   Diagnosis Date Noted  . Hereditary and idiopathic peripheral neuropathy 06/04/2015  . Lumbar radiculopathy 05/22/2015  . Occipital neuralgia 05/13/2015  . Bilateral low back pain without sciatica 08/13/2014  . Cataract 11/12/2013  . Hearing loss 11/12/2013  . BPH (benign prostatic hyperplasia) 10/30/2012  . Bradycardia 10/30/2012  . CAD (coronary artery disease) 10/30/2012  . Hyperlipidemia LDL goal <70 10/30/2012  . Essential hypertension, benign 10/30/2012  . Chronic pancreatitis (HCC) 02/24/2010  . INSOMNIA 02/14/2010  . DEPRESSION 02/02/2010    Medications Prior to Visit Current Outpatient Prescriptions on File Prior to Visit  Medication Sig Dispense Refill  . aspirin EC 325 MG tablet Take 325 mg by mouth daily.      . clonazePAM (KLONOPIN) 2 MG tablet Take 1 tablet (2 mg total) by mouth at bedtime as needed for anxiety. Dr. Rich [facility]ve[facility] 1 tablet 0  . diclofenac sodium (VOLTAREN) 1 % GEL Apply 4 g topically 4 (four) times daily. 1 Tube 0  . FLUoxetine (PROZAC) 20 MG capsule Take 60 mg by mouth every morning.    Marland Kitchen. lisinopril (PRINIVIL,ZESTRIL) 40 MG tablet Take 1 tablet (40 mg total) by mouth daily. 90 tablet 3  . MULTIPLE VITAMIN PO Take by mouth.      . Pancrelipase, Lip-Prot-Amyl, 20000 UNITS CPEP Take 2 capsules (40,000 Units total) by mouth 3 (three) times daily before meals. Before meals 540  capsule 1  . pravastatin (PRAVACHOL) 80 MG tablet Take 1 tablet (80 mg total) by mouth daily. NEED FOLLOW UP APPOINTMENT FOR MORE REFILLS 30 tablet 0  . tamsulosin (FLOMAX) 0.4 MG CAPS capsule TAKE Two CAPSULE BY MOUTH ONCE DAILY 180 capsule 2  . traMADol (ULTRAM) 50 MG tablet Take 1 tablet (50 mg total) by mouth every 8 (eight) hours as needed. 30 tablet 0  . traZODone (DESYREL) 100 MG tablet Take 1 tablet (100 mg total) by mouth daily. 90 tablet 3   No current facility-administered medications on file prior to visit.    Current Medications (verified) Current Outpatient Prescriptions  Medication Sig Dispense Refill  . aspirin EC 325 MG tablet Take 325 mg by mouth daily.      . clonazePAM (KLONOPIN) 2 MG tablet Take 1 tablet (2 mg total) by mouth at bedtime as needed for anxiety. Dr. Rich Brave[facility] 1 tablet 0  . diclofenac sodium (VOLTAREN) 1 % GEL Apply 4 g topically 4 (four) times daily. 1 Tube 0  . FLUoxetine (PROZAC) 20 MG capsule Take 60 mg by mouth every morning.    Marland Kitchen. lisinopril (PRINIVIL,ZESTRIL) 40 MG tablet Take 1 tablet (40 mg total) by mouth daily. 90 tablet 3  . MULTIPLE VITAMIN PO Take by mouth.      . Pancrelipase, Lip-Prot-Amyl, 20000 UNITS CPEP Take 2 capsules (40,000 Units total) by mouth 3 (three) times daily before meals. Before meals 540 capsule 1  . pravastatin (PRAVACHOL) 80 MG tablet  Take 1 tablet (80 mg total) by mouth daily. NEED FOLLOW UP APPOINTMENT FOR MORE REFILLS 30 tablet 0  . tamsulosin (FLOMAX) 0.4 MG CAPS capsule TAKE Two CAPSULE BY MOUTH ONCE DAILY 180 capsule 2  . traMADol (ULTRAM) 50 MG tablet Take 1 tablet (50 mg total) by mouth every 8 (eight) hours as needed. 30 tablet 0  . traZODone (DESYREL) 100 MG tablet Take 1 tablet (100 mg total) by mouth daily. 90 tablet 3   No current facility-administered medications for this visit.     Allergies (verified) Review of patient's allergies indicates no known allergies.   PAST HISTORY  Family  History Family History  Problem Relation Age of Onset  . Lung cancer Mother   . Hypertension Mother   . Hyperlipidemia Mother   . Hypertension Sister     Social History Social History  Substance Use Topics  . Smoking status: Former Games developer  . Smokeless tobacco: Not on file     Comment: quit 1996  . Alcohol Use: No    Are there smokers in your home (other than you)?  No  Risk Factors Current exercise habits: The patient does not participate in regular exercise at present.  Dietary issues discussed: No   Cardiac risk factors: advanced age (older than 64 for men, 22 for women), dyslipidemia, hypertension, male gender, obesity (BMI >= 30 kg/m2) and sedentary lifestyle.  Depression Screen (Note: if answer to either of the following is "Yes", a more complete depression screening is indicated)   Q1: Over the past two weeks, have you felt down, depressed or hopeless? No  Q2: Over the past two weeks, have you felt little interest or pleasure in doing things? No  Have you lost interest or pleasure in daily life? No  Do you often feel hopeless? No  Do you cry easily over simple problems? No  Activities of Daily Living In your present state of health, do you have any difficulty performing the following activities?:  Driving? No Managing money?  No Feeding yourself? No Getting from bed to chair? No Climbing a flight of stairs? No Preparing food and eating?: No Bathing or showering? No Getting dressed: No Getting to the toilet? No Using the toilet:No Moving around from place to place: No In the past year have you fallen or had a near fall?:No   Are you sexually active?  No  Do you have more than one partner?  No  Hearing Difficulties: No Do you often ask people to speak up or repeat themselves? No Do you experience ringing or noises in your ears? No Do you have difficulty understanding soft or whispered voices? No   Do you feel that you have a problem with memory? No  Do  you often misplace items? No  Do you feel safe at home?  Yes  Cognitive Testing  Alert? Yes  Normal Appearance?Yes  Oriented to person? Yes  Place? Yes   Time? Yes  Recall of three objects?  Yes  Can perform simple calculations? Yes  Displays appropriate judgment?Yes  Can read the correct time from a watch face?Yes   Advanced Directives have been discussed with the patient? No   List the Names of Other Physician/Practitioners you currently use: 1.    Indicate any recent Medical Services you may have received from other than [facility] providers in the past year (date may be approximate).  Immunization History  Administered Date(s) Administered  . Influenza Split 05/31/2011  . Influenza,inj,Quad PF,36+ Mos 11/12/2013  .  Influenza-Unspecified 07/17/2015  . Pneumococcal Conjugate-13 07/17/2015  . Pneumococcal Polysaccharide-23 03/05/2009  . Td 09/05/2004    Screening Tests Health Maintenance  Topic Date Due  . ZOSTAVAX  06/12/2003  . TETANUS/TDAP  09/05/2014  . COLONOSCOPY  02/08/2016  . INFLUENZA VACCINE  04/05/2016  . PNA vac Low Risk Adult  Completed    All answers were reviewed with the patient and necessary referrals were made:  Rodney Langton, MD   11/19/2015   History reviewed: allergies, current medications, past family history, past medical history, past social history, past surgical history and problem list  Review of Systems A comprehensive review of systems was negative.    Objective:     Vision by Snellen chart: right eye:20/20, left eye:20/20 There were no vitals taken for this visit. There is no weight on file to calculate BMI. General: Well Developed, well nourished, and in no acute distress.  Neuro: Alert and oriented x3, extra-ocular muscles intact, sensation grossly intact. Cranial nerves II through XII are intact, motor, sensory, and coordinative functions are all intact. HEENT: Normocephalic, atraumatic, pupils equal round reactive to light,  neck supple, no masses, no lymphadenopathy, thyroid nonpalpable. Oropharynx, nasopharynx, external ear canals are unremarkable. Skin: Warm and dry, no rashes noted.  Cardiac: Regular rate and rhythm, no rubs or gallops, 1-2/6 systolic ejection murmur Respiratory: Clear to auscultation bilaterally. Not using accessory muscles, speaking in full sentences.  Abdominal: Soft, nontender, nondistended, positive bowel sounds, no masses, no organomegaly.  Musculoskeletal: Shoulder, elbow, wrist, hip, knee, ankle stable, and with full range of motion. Rectal: Smooth prostate, good tone, Hemoccult negative.   Twelve-lead ECG reviewed, normal axis, normal rate, normal rhythm, no ST changes, sinus bradycardia. Assessment:     Healthy male with a systolic murmur      Plan:     During the course of the visit the patient was educated and counseled about appropriate screening and preventive services including:    Screening electrocardiogram  Diet review for nutrition referral? Yes ____  Not Indicated __x__   Patient Instructions (the written plan) was given to the patient.  Medicare Attestation I have personally reviewed: The patient's medical and social history Their use of alcohol, tobacco or illicit drugs Their current medications and supplements The patient's functional ability including ADLs,fall risks, home safety risks, cognitive, and hearing and visual impairment Diet and physical activities Evidence for depression or mood disorders  The patient's weight, height, BMI, and visual acuity have been recorded in the chart.  I have made referrals, counseling, and provided education to the patient based on review of the above and I have provided the patient with a written personalized care plan for preventive services.     Rodney Langton, MD   11/19/2015

## 2015-11-19 NOTE — Assessment & Plan Note (Signed)
Well controlled, no changes 

## 2015-11-19 NOTE — Assessment & Plan Note (Signed)
Asymptomatic, switching aspirin down to 81 mg

## 2015-11-19 NOTE — Assessment & Plan Note (Signed)
Suspect aortic stenosis, asymptomatic. Ordering echocardiogram for baseline.

## 2015-11-19 NOTE — Assessment & Plan Note (Signed)
Medicare physical as above.  

## 2015-11-19 NOTE — Telephone Encounter (Signed)
Opened to complete DMV forms

## 2015-11-19 NOTE — Assessment & Plan Note (Signed)
Unremarkable prostate exam, has not noted any improvement on Flomax, symptoms do sound to be overactive bladder, discontinue Flomax, switch to [employer]

## 2015-11-20 NOTE — Addendum Note (Signed)
Addended by: Collie SiadICHARDSON, Mycal Conde M on: 11/20/2015 10:50 AM   Modules accepted: Orders

## 2015-12-02 ENCOUNTER — Other Ambulatory Visit: Payer: Self-pay | Admitting: Family Medicine

## 2015-12-08 ENCOUNTER — Telehealth (HOSPITAL_COMMUNITY): Payer: Self-pay | Admitting: Sports Medicine

## 2015-12-09 ENCOUNTER — Ambulatory Visit (HOSPITAL_BASED_OUTPATIENT_CLINIC_OR_DEPARTMENT_OTHER): Payer: Medicare PPO

## 2015-12-15 ENCOUNTER — Encounter: Payer: Self-pay | Admitting: Family Medicine

## 2015-12-15 ENCOUNTER — Ambulatory Visit (INDEPENDENT_AMBULATORY_CARE_PROVIDER_SITE_OTHER): Payer: Medicare PPO | Admitting: Family Medicine

## 2015-12-15 VITALS — BP 108/66 | HR 54 | Wt 242.0 lb

## 2015-12-15 DIAGNOSIS — G479 Sleep disorder, unspecified: Secondary | ICD-10-CM | POA: Diagnosis not present

## 2015-12-15 DIAGNOSIS — F329 Major depressive disorder, single episode, unspecified: Secondary | ICD-10-CM

## 2015-12-15 DIAGNOSIS — G4700 Insomnia, unspecified: Secondary | ICD-10-CM | POA: Diagnosis not present

## 2015-12-15 DIAGNOSIS — F32A Depression, unspecified: Secondary | ICD-10-CM

## 2015-12-15 DIAGNOSIS — N3281 Overactive bladder: Secondary | ICD-10-CM | POA: Diagnosis not present

## 2015-12-15 MED ORDER — TRAZODONE HCL 100 MG PO TABS: 100.0000 mg | ORAL_TABLET | Freq: Every evening | ORAL | Status: DC | PRN

## 2015-12-15 MED ORDER — PRAVASTATIN SODIUM 80 MG PO TABS
80.0000 mg | ORAL_TABLET | Freq: Every day | ORAL | Status: DC
Start: 2015-12-15 — End: 2016-08-15

## 2015-12-15 MED ORDER — CLONAZEPAM 2 MG PO TABS: 2.0000 mg | ORAL_TABLET | Freq: Every evening | ORAL | Status: DC | PRN

## 2015-12-15 MED ORDER — FLUOXETINE HCL 20 MG PO CAPS: 60.0000 mg | ORAL_CAPSULE | ORAL | Status: DC

## 2015-12-15 NOTE — Progress Notes (Signed)
CC: Miguel Glenn is a 75 y.o. male is here for Follow-up and Medication Management   Subjective: HPI:  Follow-up overactive bladder: Since stopping Flomax and switching to Mybetric he's had a drastic increase in the amount of time he can wait between having to go void. He still has urinary urgency but there's been no accidents. He is quite pleased with the effect of this medication. He denies any known side effects.  He wants to know if I can help with prescribing his insomnia medication and depression medication. He tells me he's been taking the current combination of clonazepam and trazodone for sleep and generic Prozac for depression for years now without any need for adjustments and it's cheaper for him to see me for this medication is undergoing to his psychiatrist. He denies any anxiety or depression or sleep disturbance Friday he takes this medication daily. \  Review Of Systems Outlined In HPI  Past Medical History  Diagnosis Date  . HTN (hypertension)   . HLD (hyperlipidemia)   . Depression with anxiety   . CAD (coronary artery disease)     stents placed in [place] previously  . DJD of shoulder   . Chronic pancreatitis (HCC)   . Asthma   . DDD (degenerative disc disease), lumbar   . Bladder spasm   . IFG (impaired fasting glucose)   . Cat allergies   . Colon polyps     hx  . PUD (peptic ulcer disease)     gi bleed    Past Surgical History  Procedure Laterality Date  . Appendectomy    . R shoulder replacement  12/10    Dr. Marlyne BeardsJennings  . Rtc surgery bilat    . Cholecystectomy    . Hiatal hernia repair  1991  . Abdominal exploration surgery  1990s   Family History  Problem Relation Age of Onset  . Lung cancer Mother   . Hypertension Mother   . Hyperlipidemia Mother   . Hypertension Sister     Social History   Social History  . Marital Status: Married    Spouse Name: N/A  . Number of Children: N/A  . Years of Education: N/A   Occupational History  . Not on  file.   Social History Main Topics  . Smoking status: Former Games developermoker  . Smokeless tobacco: Not on file     Comment: quit 1996  . Alcohol Use: No  . Drug Use: Not on file  . Sexual Activity: Not on file   Other Topics Concern  . Not on file   Social History Narrative   [facility] [employer]     Objective: BP 108/66 mmHg  Pulse 54  Wt 242 lb (109.77 kg)  General: Alert and Oriented, No Acute Distress HEENT: Pupils equal, round, reactive to light. Conjunctivae clear.  Moist mucous membranes Lungs: Clear to auscultation bilaterally, no wheezing/ronchi/rales.  Comfortable work of breathing. Good air movement. Cardiac: Regular rate and rhythm. Normal S1/S2.  No murmurs, rubs, nor gallops.  No carotid bruit Extremities: No peripheral edema.  Strong peripheral pulses.  Mental Status: No depression, anxiety, nor agitation. Skin: Warm and dry.  Assessment & Plan: Miguel Glenn was seen today for follow-up and medication management.  Diagnoses and all orders for this visit:  Overactive bladder  INSOMNIA  Sleep disorder -     traZODone (DESYREL) 100 MG tablet; Take 1 tablet (100 mg total) by mouth at bedtime as needed for sleep. -  clonazePAM (KLONOPIN) 2 MG tablet; Take 1 tablet (2 mg total) by mouth at bedtime as needed for anxiety.  Depression -     traZODone (DESYREL) 100 MG tablet; Take 1 tablet (100 mg total) by mouth at bedtime as needed for sleep. -     FLUoxetine (PROZAC) 20 MG capsule; Take 3 capsules (60 mg total) by mouth every morning.  Other orders -     pravastatin (PRAVACHOL) 80 MG tablet; Take 1 tablet (80 mg total) by mouth daily.   Insomnia and depression are controlled with trazodone/clonazepam and generic Prozac respectively. Happy to help out with this Overactive bladder: Currently controlled with by Myrbetriq He was told he has systolic murmur at his last visit, I reassured him that I cannot detect a murmur during today's encounter  25 minutes spent  face-to-face during visit today of which at least 50% was counseling or coordinating care regarding: 1. Overactive bladder   2. INSOMNIA   3. Sleep disorder   4. Depression       Return in about 3 months (around 03/15/2016) for BP and Mood follow up.

## 2015-12-22 ENCOUNTER — Ambulatory Visit: Payer: Medicare PPO | Admitting: Family Medicine

## 2016-01-07 ENCOUNTER — Other Ambulatory Visit (HOSPITAL_BASED_OUTPATIENT_CLINIC_OR_DEPARTMENT_OTHER): Payer: Medicare PPO

## 2016-06-17 ENCOUNTER — Ambulatory Visit (INDEPENDENT_AMBULATORY_CARE_PROVIDER_SITE_OTHER): Payer: Medicare PPO | Admitting: Osteopathic Medicine

## 2016-06-17 ENCOUNTER — Encounter: Payer: Self-pay | Admitting: Osteopathic Medicine

## 2016-06-17 VITALS — BP 143/72 | HR 52 | Ht 77.0 in | Wt 244.0 lb

## 2016-06-17 DIAGNOSIS — I10 Essential (primary) hypertension: Secondary | ICD-10-CM | POA: Diagnosis not present

## 2016-06-17 DIAGNOSIS — G479 Sleep disorder, unspecified: Secondary | ICD-10-CM | POA: Diagnosis not present

## 2016-06-17 DIAGNOSIS — Z131 Encounter for screening for diabetes mellitus: Secondary | ICD-10-CM | POA: Diagnosis not present

## 2016-06-17 DIAGNOSIS — Z23 Encounter for immunization: Secondary | ICD-10-CM | POA: Diagnosis not present

## 2016-06-17 DIAGNOSIS — G4700 Insomnia, unspecified: Secondary | ICD-10-CM

## 2016-06-17 LAB — LIPID PANEL
CHOL/HDL RATIO: 2.7 ratio (ref <=5.0)
CHOLESTEROL: 120 mg/dL — AB (ref 125–200)
HDL: 45 mg/dL (ref >=40)
LDL CALC: 55 mg/dL (ref <130)
TRIGLYCERIDES: 98 mg/dL (ref <150)
VLDL: 20 mg/dL (ref <30)

## 2016-06-17 LAB — CBC WITH DIFFERENTIAL/PLATELET
BASOS ABS: 40 {cells}/uL (ref 0–200)
Basophils Relative: 1 %
EOS ABS: 120 {cells}/uL (ref 15–500)
EOS PCT: 3 %
HCT: 45.7 % (ref 38.5–50.0)
Hemoglobin: 15.7 g/dL (ref 13.2–17.1)
LYMPHS PCT: 38 %
Lymphs Abs: 1520 cells/uL (ref 850–3900)
MCH: 31 pg (ref 27.0–33.0)
MCHC: 34.4 g/dL (ref 32.0–36.0)
MCV: 90.1 fL (ref 80.0–100.0)
MONOS PCT: 10 %
MPV: 10.4 fL (ref 7.5–12.5)
Monocytes Absolute: 400 cells/uL (ref 200–950)
NEUTROS ABS: 1920 {cells}/uL (ref 1500–7800)
Neutrophils Relative %: 48 %
PLATELETS: 185 10*3/uL (ref 140–400)
RBC: 5.07 MIL/uL (ref 4.20–5.80)
RDW: 13.5 % (ref 11.0–15.0)
WBC: 4 10*3/uL (ref 3.8–10.8)

## 2016-06-17 LAB — TSH: TSH: 1.38 mIU/L (ref 0.40–4.50)

## 2016-06-17 LAB — COMPLETE METABOLIC PANEL WITH GFR
ALT: 20 U/L (ref 9–46)
AST: 18 U/L (ref 10–35)
Albumin: 4.4 g/dL (ref 3.6–5.1)
Alkaline Phosphatase: 60 U/L (ref 40–115)
BILIRUBIN TOTAL: 0.7 mg/dL (ref 0.2–1.2)
BUN: 18 mg/dL (ref 7–25)
CHLORIDE: 106 mmol/L (ref 98–110)
CO2: 25 mmol/L (ref 20–31)
Calcium: 9.3 mg/dL (ref 8.6–10.3)
Creat: 1.09 mg/dL (ref 0.70–1.18)
GFR, EST AFRICAN AMERICAN: 77 mL/min (ref >=60)
GFR, EST NON AFRICAN AMERICAN: 67 mL/min (ref >=60)
GLUCOSE: 99 mg/dL (ref 65–99)
POTASSIUM: 4.1 mmol/L (ref 3.5–5.3)
SODIUM: 141 mmol/L (ref 135–146)
TOTAL PROTEIN: 6.9 g/dL (ref 6.1–8.1)

## 2016-06-17 LAB — HEMOGLOBIN A1C
Hgb A1c MFr Bld: 5.5 % (ref <5.7)
Mean Plasma Glucose: 111 mg/dL

## 2016-06-17 MED ORDER — CLONAZEPAM 1 MG PO TABS
1.0000 mg | ORAL_TABLET | Freq: Every day | ORAL | 0 refills | Status: DC
Start: 2016-06-17 — End: 2016-07-18

## 2016-06-17 MED ORDER — TRAZODONE HCL 150 MG PO TABS: 150.0000 mg | ORAL_TABLET | Freq: Every evening | ORAL | 1 refills | Status: DC | PRN

## 2016-06-17 NOTE — Progress Notes (Signed)
HPI: Miguel Glenn is a 75 y.o. male  who presents to [facility] [place] today, 06/17/16,  for chief complaint of:  Chief Complaint  Patient presents with  . Establish Care    MEDICATION REFILL    Previous patient of Dr. Ivan Anchors here to establish care with me.   Insomnia: States Dr. Ivan Anchors, at last visit, had tentatively agreed to take over the prescription of the patient's insomnia medications: Clonazepam 2 mg plus trazodone 150 mg daily at bedtime, though he has been cutting the trazodone in half. Patient is still working as a Naval architect, reports has been on clonazepam for quite some time now, previous workup for sleep apnea with negative so his psychiatrist had put him on the above regimen. He overall been doing pretty well with this. He is open to trying new medications, however.   Psychiatric: Patient previously suffering from depression after first wife had died by suicide. Now is remarried, has been weaning himself off of fluoxetine and is down to 10 mg daily. Denies depression, anxiety area does admit to some irritability and short tempered, and his wife would agree with this. He has never been physically violent or for believe he says  Patient also requests routine blood work, has been some time since this was done.  Patient is accompanied by wife who assists with history   Past medical, surgical, social and family history reviewed: Past Medical History:  Diagnosis Date  . Asthma   . Bladder spasm   . CAD (coronary artery disease)    stents placed in [place] previously  . Cat allergies   . Chronic pancreatitis (HCC)   . Colon polyps    hx  . DDD (degenerative disc disease), lumbar   . Depression with anxiety   . DJD of shoulder   . HLD (hyperlipidemia)   . HTN (hypertension)   . IFG (impaired fasting glucose)   . PUD (peptic ulcer disease)    gi bleed   Past Surgical History:  Procedure Laterality Date  . ABDOMINAL EXPLORATION SURGERY   1990s  . APPENDECTOMY    . CHOLECYSTECTOMY    . HIATAL HERNIA REPAIR  1991  . r shoulder replacement  12/10   Dr. Marlyne Beards  . RTC surgery bilat     Social History  Substance Use Topics  . Smoking status: Former Games developer  . Smokeless tobacco: Never Used     Comment: quit 1996  . Alcohol use No   Family History  Problem Relation Age of Onset  . Lung cancer Mother   . Hypertension Mother   . Hyperlipidemia Mother   . Hypertension Sister      Current medication list and allergy/intolerance information reviewed:   Current Outpatient Prescriptions  Medication Sig Dispense Refill  . aspirin EC 81 MG tablet Take 1 tablet (81 mg total) by mouth daily. 90 tablet 3  . clonazePAM (KLONOPIN) 2 MG tablet Take 1 tablet (2 mg total) by mouth at bedtime as needed for anxiety. 90 tablet 2  . FLUoxetine (PROZAC) 20 MG capsule Take 3 capsules (60 mg total) by mouth every morning. 270 capsule 1  . lisinopril (PRINIVIL,ZESTRIL) 40 MG tablet Take 1 tablet (40 mg total) by mouth daily. 90 tablet 3  . mirabegron ER (MYRBETRIQ) 50 MG TB24 tablet Take 1 tablet (50 mg total) by mouth daily. 30 tablet 11  . MULTIPLE VITAMIN PO Take by mouth.      . pravastatin (PRAVACHOL) 80 MG tablet Take  1 tablet (80 mg total) by mouth daily. 90 tablet 3  . traZODone (DESYREL) 100 MG tablet Take 1 tablet (100 mg total) by mouth at bedtime as needed for sleep. 90 tablet 3   No current facility-administered medications for this visit.    No Known Allergies    Review of Systems:  Constitutional:  No  fever, no chills, No recent illness, No unintentional weight changes. No significant fatigue.   HEENT: No  headache, no vision change  Cardiac: No  chest pain, No  Pressure, no exercise tolerance changes or problems  Respiratory:  No  shortness of breath. No  Cough  Neurologic: No  weakness, No  dizzines  Psychiatric: No  concerns with depression, No  concerns with anxiety, +sleep problems, No mood problems  Exam:   BP (!) 143/72   Pulse (!) 52   Ht 6\' 5"  (1.956 m)   Wt 244 lb (110.7 kg)   BMI 28.93 kg/m   Constitutional: VS see above. General Appearance: alert, well-developed, well-nourished, NAD  Eyes: Normal lids and conjunctive, non-icteric sclera  Ears, Nose, Mouth, Throat: MMM, Normal external inspection ears/nares/mouth/lips/gums.  Neck: No masses, trachea midline. No thyroid enlargement. No tenderness/mass appreciated. No lymphadenopathy  Respiratory: Normal respiratory effort. no wheeze, no rhonchi, no rales  Cardiovascular: S1/S2 normal, no murmur, no rub/gallop auscultated. RRR.   Gastrointestinal: Nontender, no masses. No hepatomegaly, no splenomegaly. No hernia appreciated. Bowel sounds normal. Rectal exam deferred.   Musculoskeletal: Gait normal.   Neurological: Normal balance/coordination. No tremor.   Skin: warm, dry, intact.  Psychiatric: Normal judgment/insight. Normal mood and affect. Oriented x3.      ASSESSMENT/PLAN:   Plan to wean down on clonazepam, had a discussion with the patient regarding risks of long-term use of this medication and he is very amenable to changing to an alternative. Plan to decrease clonazepam, plan to take whole tablet of the trazodone. Eventual plan to wean off of the clonazepam altogether, plan for next prescription to be 0.5 mg for 1-2 months before discontinuation. Consider alternative therapy such as Belsomra or other  Insomnia, unspecified type - Plan: clonazePAM (KLONOPIN) 1 MG tablet  Need for prophylactic vaccination and inoculation against influenza - Plan: Flu Vaccine QUAD 36+ mos IM  Need for diphtheria-tetanus-pertussis (Tdap) vaccine, adult/adolescent - Plan: Tdap vaccine greater than or equal to 7yo IM  Sleep disorder - Plan: traZODone (DESYREL) 150 MG tablet  Essential hypertension, benign - Plan: CBC with Differential/Platelet, COMPLETE METABOLIC PANEL WITH GFR, TSH, Lipid panel, Hemoglobin A1c     Visit summary with  medication list and pertinent instructions was printed for patient to review. All questions at time of visit were answered - patient instructed to contact office with any additional concerns. ER/RTC precautions were reviewed with the patient. Follow-up plan: Return in about 4 weeks (around 07/15/2016) for ANNUAL PHYSICAL and review medicaion changes, come back sooner if needed.

## 2016-07-06 DIAGNOSIS — D2361 Other benign neoplasm of skin of right upper limb, including shoulder: Secondary | ICD-10-CM | POA: Diagnosis not present

## 2016-07-06 DIAGNOSIS — D2239 Melanocytic nevi of other parts of face: Secondary | ICD-10-CM | POA: Diagnosis not present

## 2016-07-15 ENCOUNTER — Ambulatory Visit: Payer: Medicare PPO | Admitting: Osteopathic Medicine

## 2016-07-18 ENCOUNTER — Ambulatory Visit (INDEPENDENT_AMBULATORY_CARE_PROVIDER_SITE_OTHER): Payer: Medicare PPO | Admitting: Osteopathic Medicine

## 2016-07-18 VITALS — BP 148/89 | HR 52 | Wt 248.0 lb

## 2016-07-18 DIAGNOSIS — Z8669 Personal history of other diseases of the nervous system and sense organs: Secondary | ICD-10-CM

## 2016-07-18 DIAGNOSIS — I10 Essential (primary) hypertension: Secondary | ICD-10-CM | POA: Diagnosis not present

## 2016-07-18 DIAGNOSIS — G479 Sleep disorder, unspecified: Secondary | ICD-10-CM

## 2016-07-18 DIAGNOSIS — G4700 Insomnia, unspecified: Secondary | ICD-10-CM | POA: Diagnosis not present

## 2016-07-18 MED ORDER — TRAZODONE HCL 150 MG PO TABS: 150.0000 mg | ORAL_TABLET | Freq: Every day | ORAL | 1 refills | Status: DC

## 2016-07-18 MED ORDER — CLONAZEPAM 0.5 MG PO TABS: 0.5000 mg | ORAL_TABLET | Freq: Every day | ORAL | 0 refills | Status: DC

## 2016-07-18 MED ORDER — SUVOREXANT 15 MG PO TABS: 15.0000 mg | ORAL_TABLET | Freq: Every day | ORAL | 0 refills | Status: DC

## 2016-07-18 MED ORDER — SUVOREXANT 10 MG PO TABS: 10.0000 mg | ORAL_TABLET | Freq: Every day | ORAL | 0 refills | Status: DC

## 2016-07-18 MED ORDER — SUVOREXANT 20 MG PO TABS: 20.0000 mg | ORAL_TABLET | Freq: Every day | ORAL | 0 refills | Status: DC

## 2016-07-18 NOTE — Progress Notes (Signed)
HPI: Miguel Glenn is a 75 y.o. male  who presents to [facility] [place] today, 07/18/16,  for chief complaint of:  Chief Complaint  Patient presents with  . refill medications  . Suture / Staple Removal    Patient here for follow-up on insomnia/mood issues.  Insomnia:   At last visit, pt stated Dr. Ivan AnchorsHommel, at a prior visit, had tentatively agreed to take over the prescription of the patient's insomnia medications:   Initially, Clonazepam 2 mg plus trazodone 75 mg daily at bedtime.  Patient is working as a Naval architecttruck driver, reports has been on clonazepam for quite some time now, previous workup for sleep apnea with negative except for difficulty going into REM sleep so his psychiatrist had put him on the above regimen. He overall been doing pretty well with this. He is open to trying new medications, however.   At last visit, plan was to wean down on the clonazepam that he is taking for sleep, increased trazodone."Eventual plan to wean off of the clonazepam altogether, plan for next prescription to be 0.5 mg for 1-2 months before discontinuation. Consider alternative therapy such as Belsomra or other"  At this point, patient is interested in "mild sleep medication" such as Miguel Glenn, he is okay to discontinue the clonazepam though he is currently been taking 1 mg daily at bedtime.  Psychiatric:   Patient previously suffering from depression after first wife had died by suicide. Now is remarried, has weaned himself off of fluoxetine. Denies depression, anxiety although he does admit to some irritability and short tempered, and his wife would agree with this. He has never been physically violent or verbally abusive. He would like to continue trazodone for this reason.  Headache:  Patient is on Topamax 50 mg daily, we did not have this on his medication list. Reports history of cluster headaches.   Skin: Recent dermatologic procedure, patient has sutures in place  for about 2 weeks on right arm, requests removal. States that dermatologist had told him to get these removed at [facility] or other urgent care in a few days from now.   Patient is accompanied by wife who assists with history   Past medical, surgical, social and family history reviewed: Past Medical History:  Diagnosis Date  . Asthma   . Bladder spasm   . CAD (coronary artery disease)    stents placed in [place] previously  . Cat allergies   . Chronic pancreatitis (HCC)   . Colon polyps    hx  . DDD (degenerative disc disease), lumbar   . Depression with anxiety   . DJD of shoulder   . HLD (hyperlipidemia)   . HTN (hypertension)   . IFG (impaired fasting glucose)   . PUD (peptic ulcer disease)    gi bleed   Past Surgical History:  Procedure Laterality Date  . ABDOMINAL EXPLORATION SURGERY  1990s  . APPENDECTOMY    . CHOLECYSTECTOMY    . HIATAL HERNIA REPAIR  1991  . r shoulder replacement  12/10   Dr. Marlyne BeardsJennings  . RTC surgery bilat     Social History  Substance Use Topics  . Smoking status: Former Games developermoker  . Smokeless tobacco: Never Used     Comment: quit 1996  . Alcohol use No   Family History  Problem Relation Age of Onset  . Lung cancer Mother   . Hypertension Mother   . Hyperlipidemia Mother   . Hypertension Sister  Current medication list and allergy/intolerance information reviewed:   Current Outpatient Prescriptions  Medication Sig Dispense Refill  . aspirin EC 81 MG tablet Take 1 tablet (81 mg total) by mouth daily. 90 tablet 3  . clonazePAM (KLONOPIN) 1 MG tablet Take 1 tablet (1 mg total) by mouth at bedtime. 30 tablet 0  . FLUoxetine (PROZAC) 20 MG capsule Take 3 capsules (60 mg total) by mouth every morning. 270 capsule 1  . lisinopril (PRINIVIL,ZESTRIL) 40 MG tablet Take 1 tablet (40 mg total) by mouth daily. 90 tablet 3  . mirabegron ER (MYRBETRIQ) 50 MG TB24 tablet Take 1 tablet (50 mg total) by mouth daily. 30 tablet 11  . MULTIPLE VITAMIN PO  Take by mouth.      . pravastatin (PRAVACHOL) 80 MG tablet Take 1 tablet (80 mg total) by mouth daily. 90 tablet 3  . traZODone (DESYREL) 150 MG tablet Take 1 tablet (150 mg total) by mouth at bedtime as needed for sleep. 30 tablet 1   No current facility-administered medications for this visit.    No Known Allergies    Review of Systems:  Constitutional:  No  fever, no chills, No recent illness, No unintentional weight changes. No significant fatigue.   HEENT: No  headache, no vision change  Cardiac: No  chest pain, No  Pressure, no exercise tolerance changes or problems  Respiratory:  No  shortness of breath. No  Cough  Neurologic: No  weakness, No  dizzines  Psychiatric: No  concerns with depression, No  concerns with anxiety, +sleep problems, No mood problems  Exam:  BP (!) 148/89   Pulse (!) 52   Wt 248 lb (112.5 kg)   BMI 29.41 kg/m   Constitutional: VS see above. General Appearance: alert, well-developed, well-nourished, NAD  Eyes: Normal lids and conjunctive, non-icteric sclera  Ears, Nose, Mouth, Throat: MMM, Normal external inspection ears/nares/mouth/lips/gums.  Neck: No masses, trachea midline. No thyroid enlargement. No tenderness/mass appreciated. No lymphadenopathy  Respiratory: Normal respiratory effort. no wheeze, no rhonchi, no rales  Cardiovascular: S1/S2 normal, no murmur, no rub/gallop auscultated. RRR.   Gastrointestinal: Nontender, no masses. No hepatomegaly, no splenomegaly. No hernia appreciated. Bowel sounds normal. Rectal exam deferred.   Musculoskeletal: Gait normal.   Neurological: Normal balance/coordination. No tremor.   Skin: warm, dry, intact. Sutures removed from right arm 3 without difficulty.  Psychiatric: Normal judgment/insight. Normal mood and affect. Oriented x3.    Depression screen [facility] 2/9 07/18/2016 11/12/2013  Decreased Interest 0 0  Down, Depressed, Hopeless 0 0  PHQ - 2 Score 0 0  Altered sleeping 0 -  Tired,  decreased energy 0 -  Change in appetite 0 -  Feeling bad or failure about yourself  2 -  Trouble concentrating 0 -  Moving slowly or fidgety/restless 0 -  Suicidal thoughts 0 -  PHQ-9 Score 2 -    GAD 7 : Generalized Anxiety Score 07/18/2016  Nervous, Anxious, on Edge 0  Control/stop worrying 1  Worry too much - different things 1  Trouble relaxing 1  Restless 0  Easily annoyed or irritable 0  Afraid - awful might happen 0  Total GAD 7 Score 3       ASSESSMENT/PLAN: Would like to wean down a bit further on the benzodiazepine prior to starting Belsomra. Continue current trazodone at 150. Instructions reviewed with the patient, see below.  Insomnia, unspecified type - Plan: clonazePAM (KLONOPIN) 0.5 MG tablet, traZODone (DESYREL) 150 MG tablet, Suvorexant (BELSOMRA) 10 MG TABS,  Suvorexant (BELSOMRA) 15 MG TABS, Suvorexant (BELSOMRA) 20 MG TABS  History of cluster headache  Essential hypertension, benign  Sleep disorder - Plan: traZODone (DESYREL) 150 MG tablet   Patient Instructions  Plan: 1. Reduce dose of the Clonazepam from 1 mg to 0.5 mg for a few weeks and then can stop and have some of this medicine left over.  2. Once off the Clonazepam, we can try the Belsomra at low dose and can increase if needed every 5-7 days.  3. Come see me in 4-6 weeks and we can talk about how you're sleeping.     Visit summary with medication list and pertinent instructions was printed for patient to review. All questions at time of visit were answered - patient instructed to contact office with any additional concerns. ER/RTC precautions were reviewed with the patient. Follow-up plan: No Follow-up on file.

## 2016-07-18 NOTE — Patient Instructions (Addendum)
Plan: 1. Reduce dose of the Clonazepam from 1 mg to 0.5 mg for a few weeks and then can stop and have some of this medicine left over.  2. Once off the Clonazepam, we can try the Belsomra at low dose and can increase if needed every 5-7 days.  3. Come see me in 4-6 weeks and we can talk about how you're sleeping.

## 2016-08-15 ENCOUNTER — Ambulatory Visit (INDEPENDENT_AMBULATORY_CARE_PROVIDER_SITE_OTHER): Payer: Medicare PPO | Admitting: Osteopathic Medicine

## 2016-08-15 ENCOUNTER — Encounter: Payer: Self-pay | Admitting: Osteopathic Medicine

## 2016-08-15 VITALS — BP 128/69 | HR 55 | Ht 77.0 in | Wt 249.0 lb

## 2016-08-15 DIAGNOSIS — I10 Essential (primary) hypertension: Secondary | ICD-10-CM | POA: Diagnosis not present

## 2016-08-15 DIAGNOSIS — G479 Sleep disorder, unspecified: Secondary | ICD-10-CM

## 2016-08-15 DIAGNOSIS — G4700 Insomnia, unspecified: Secondary | ICD-10-CM | POA: Diagnosis not present

## 2016-08-15 DIAGNOSIS — R51 Headache: Secondary | ICD-10-CM

## 2016-08-15 DIAGNOSIS — E785 Hyperlipidemia, unspecified: Secondary | ICD-10-CM

## 2016-08-15 DIAGNOSIS — R519 Headache, unspecified: Secondary | ICD-10-CM

## 2016-08-15 MED ORDER — PRAVASTATIN SODIUM 80 MG PO TABS: 80.0000 mg | ORAL_TABLET | Freq: Every day | ORAL | 3 refills | Status: DC

## 2016-08-15 MED ORDER — ZOLPIDEM TARTRATE 5 MG PO TABS: 2.5000 mg | ORAL_TABLET | Freq: Every evening | ORAL | 1 refills | Status: DC | PRN

## 2016-08-15 MED ORDER — LISINOPRIL 40 MG PO TABS: 40.0000 mg | ORAL_TABLET | Freq: Every day | ORAL | 3 refills | Status: DC

## 2016-08-15 MED ORDER — TOPIRAMATE 50 MG PO TABS: 50.0000 mg | ORAL_TABLET | Freq: Every day | ORAL | 3 refills | Status: DC

## 2016-08-15 NOTE — Progress Notes (Signed)
HPI: Miguel Glenn is a 75 y.o. male  who [facility] today, 08/15/16,  for chief complaint of:  Chief Complaint  Patient presents with  . Follow-up    Insomnia    Patient here for follow-up on insomnia/mood issues.  Insomnia:   Initially, Clonazepam 2 mg plus trazodone 75 mg daily at bedtime. Previously on Ambien 10  Patient is working as a Naval architecttruck driver, reports has been on clonazepam for quite some time now, previous workup for sleep apnea with negative except for difficulty going into REM sleep so his psychiatrist had put him on the above regimen and Dr Ivan AnchorsHommel was planning to continue it.   Our plan 06/17/16 was to wean down on the clonazepam that he is taking for sleep, increased trazodone w/ eventual plan to wean off of the clonazepam altogether. As of 07/18/16 We were successful in weaning down on clonazepam, increasing trazodone. Patient was not able to get Belsomra covered but tried the 15 mg dose and found this to be expensive but also caused daytime sedation. He's down to 0.5 mg Clonazepam and would like to stop this and the trazodone and just be on Ambien if possible.   Psychiatric:   Patient previously suffering from depression after first wife had died by suicide. Now is remarried, has weaned himself off of fluoxetine. Denies depression, anxiety although he does admit to some irritability and short tempered, and his wife would agree with this. He has never been physically violent or verbally abusive.   Headache:  Patient is on Topamax 50 mg daily. Reports history of cluster headaches.   Hypertension:  Well-controlled on current regimen, requests refills.      Patient is accompanied by wife who assists with history   Past medical, surgical, social and family history reviewed: Past Medical History:  Diagnosis Date  . Asthma   . Bladder spasm   . CAD (coronary artery disease)    stents placed in [place] previously  .  Cat allergies   . Chronic pancreatitis (HCC)   . Colon polyps    hx  . DDD (degenerative disc disease), lumbar   . Depression with anxiety   . DJD of shoulder   . HLD (hyperlipidemia)   . HTN (hypertension)   . IFG (impaired fasting glucose)   . PUD (peptic ulcer disease)    gi bleed   Past Surgical History:  Procedure Laterality Date  . ABDOMINAL EXPLORATION SURGERY  1990s  . APPENDECTOMY    . CHOLECYSTECTOMY    . HIATAL HERNIA REPAIR  1991  . r shoulder replacement  12/10   Dr. Marlyne BeardsJennings  . RTC surgery bilat     Social History  Substance Use Topics  . Smoking status: Former Games developermoker  . Smokeless tobacco: Never Used     Comment: quit 1996  . Alcohol use No   Family History  Problem Relation Age of Onset  . Lung cancer Mother   . Hypertension Mother   . Hyperlipidemia Mother   . Hypertension Sister      Current medication list and allergy/intolerance information reviewed:   Current Outpatient Prescriptions  Medication Sig Dispense Refill  . aspirin EC 81 MG tablet Take 1 tablet (81 mg total) by mouth daily. 90 tablet 3  . clonazePAM (KLONOPIN) 0.5 MG tablet Take 1 tablet (0.5 mg total) by mouth at bedtime. 30 tablet 0  . lisinopril (PRINIVIL,ZESTRIL) 40 MG tablet Take 1 tablet (40 mg total) by mouth  daily. 90 tablet 3  . MULTIPLE VITAMIN PO Take by mouth.      . pravastatin (PRAVACHOL) 80 MG tablet Take 1 tablet (80 mg total) by mouth daily. 90 tablet 3  . Suvorexant (BELSOMRA) 10 MG TABS Take 10 mg by mouth at bedtime. 10 tablet 0  . Suvorexant (BELSOMRA) 15 MG TABS Take 15 mg by mouth at bedtime. 10 tablet 0  . Suvorexant (BELSOMRA) 20 MG TABS Take 20 mg by mouth at bedtime. 10 tablet 0  . topiramate (TOPAMAX) 50 MG tablet Take 50 mg by mouth daily.    . traZODone (DESYREL) 150 MG tablet Take 1 tablet (150 mg total) by mouth at bedtime. 30 tablet 1   No current facility-administered medications for this visit.    No Known Allergies    Review of  Systems:  Constitutional:  No  fever, no chills, No recent illness.   HEENT: No  headache  Cardiac: No  chest pain  Respiratory:  No  shortness of breath.  Neurologic: No  weakness, No  dizzines  Psychiatric: No  concerns with depression, No  concerns with anxiety, +sleep problems, No mood problems  Exam:  BP 128/69   Pulse (!) 55   Ht 6\' 5"  (1.956 m)   Wt 249 lb (112.9 kg)   BMI 29.53 kg/m   Constitutional: VS see above. General Appearance: alert, well-developed, well-nourished, NAD  Eyes: Normal lids and conjunctive, non-icteric sclera  Ears, Nose, Mouth, Throat: MMM, Normal external inspection ears/nares/mouth/lips/gums.  Neck: No masses, trachea midline. No thyroid enlargement. No tenderness/mass appreciated. No lymphadenopathy  Respiratory: Normal respiratory effort. no wheeze, no rhonchi, no rales  Cardiovascular: S1/S2 normal, no murmur, no rub/gallop auscultated. RRR.   Musculoskeletal: Gait normal.   Neurological: Normal balance/coordination. No tremor.   Psychiatric: Normal judgment/insight. Normal mood and affect. Oriented x3.    Depression screen [facility] 07/18/2016 11/12/2013  Decreased Interest 0 0  Down, Depressed, Hopeless 0 0  PHQ - 2 Score 0 0  Altered sleeping 0 -  Tired, decreased energy 0 -  Change in appetite 0 -  Feeling bad or failure about yourself  2 -  Trouble concentrating 0 -  Moving slowly or fidgety/restless 0 -  Suicidal thoughts 0 -  PHQ-9 Score 2 -    GAD 7 : Generalized Anxiety Score 07/18/2016  Nervous, Anxious, on Edge 0  Control/stop worrying 1  Worry too much - different things 1  Trouble relaxing 1  Restless 0  Easily annoyed or irritable 0  Afraid - awful might happen 0  Total GAD 7 Score 3       ASSESSMENT/PLAN:   Insomnia, unspecified type - Discontinue benzodiazepine and trazodone. Trial low-dose Ambien - Plan: zolpidem (AMBIEN) 5 MG tablet  Essential hypertension, benign - Plan: lisinopril  (PRINIVIL,ZESTRIL) 40 MG tablet  Sleep disorder - Plan: zolpidem (AMBIEN) 5 MG tablet  Nonintractable headache, unspecified chronicity pattern, unspecified headache type - Plan: topiramate (TOPAMAX) 50 MG tablet  Hyperlipidemia, unspecified hyperlipidemia type - Plan: pravastatin (PRAVACHOL) 80 MG tablet    Visit summary with medication list and pertinent instructions was printed for patient to review. All questions at time of visit were answered - patient instructed to contact office with any additional concerns. ER/RTC precautions were reviewed with the patient. Follow-up plan: Return in about 6 months (around 02/13/2017) for ANNUAL PHYSICAL.

## 2016-08-17 IMAGING — CR DG LUMBAR SPINE COMPLETE 4+V
5 series · 5 of 5 positions shown · non-contrast
Comparison: Abdominal radiographs 10/28/2010. Abdominal CT
02/14/2010.

CLINICAL DATA: Low back pain for 2 weeks. No acute injury. Initial
encounter.

EXAM:
LUMBAR SPINE - COMPLETE 4+ VIEW

[view not recorded (1 of 5)]
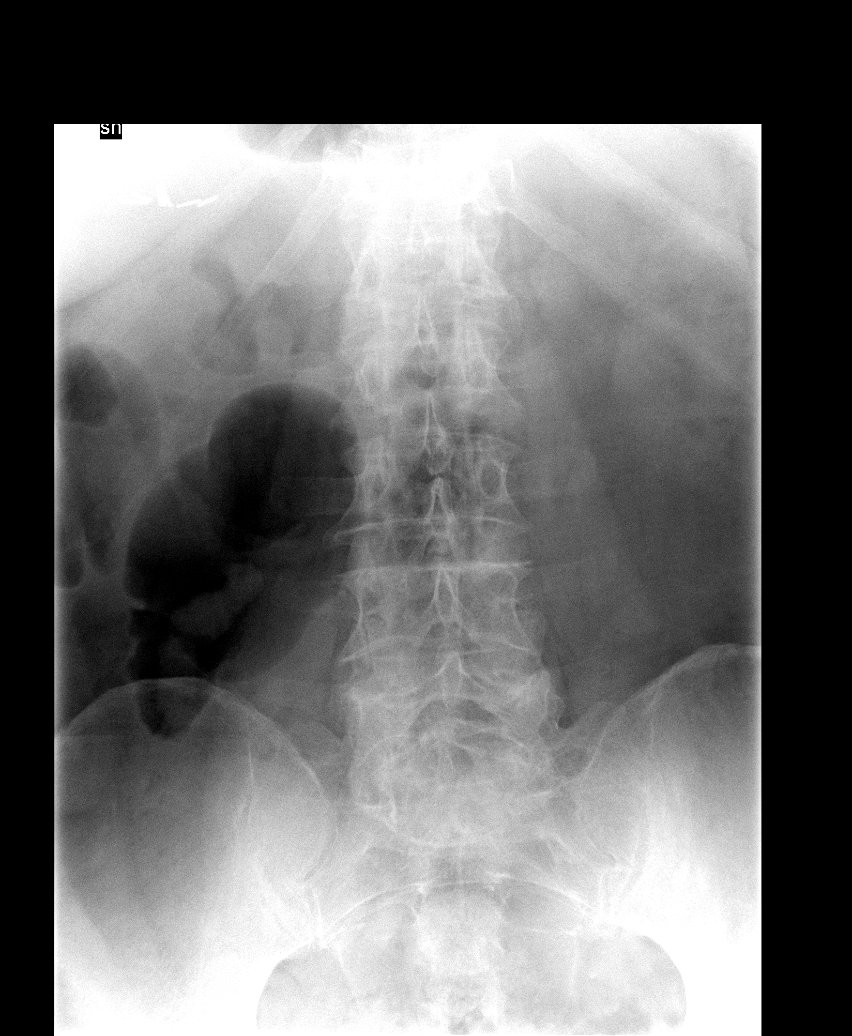

[view not recorded (2 of 5)]
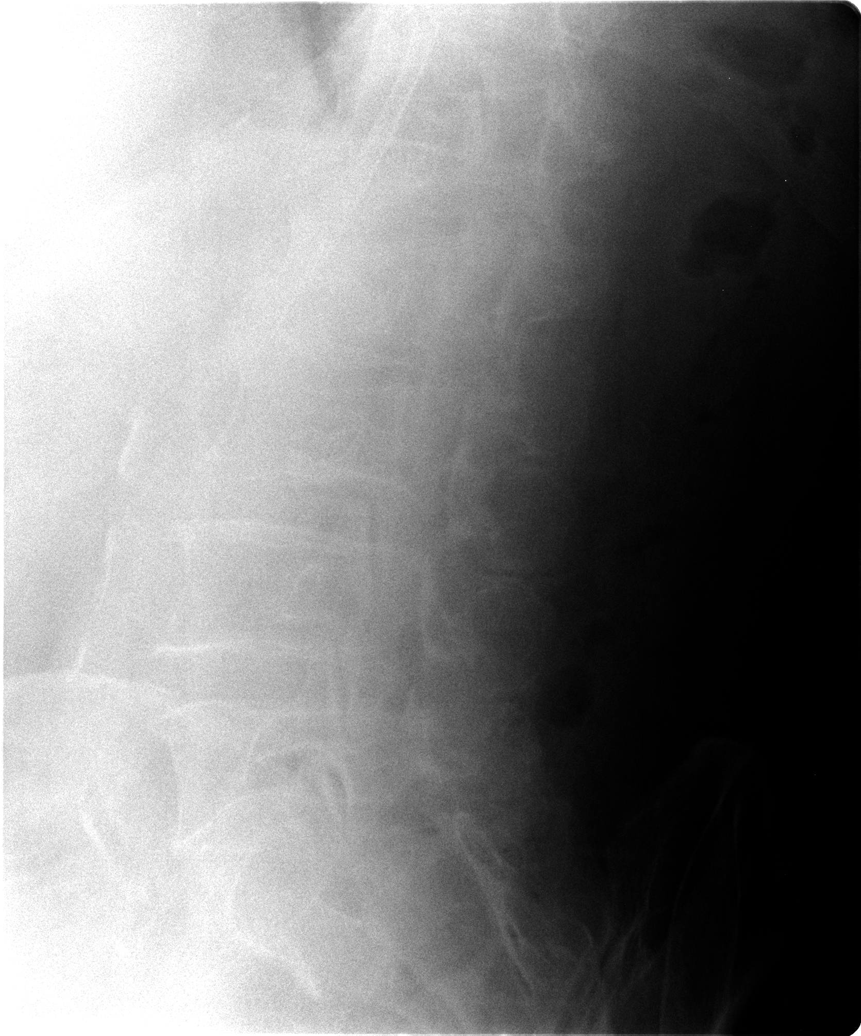

[view not recorded (3 of 5)]
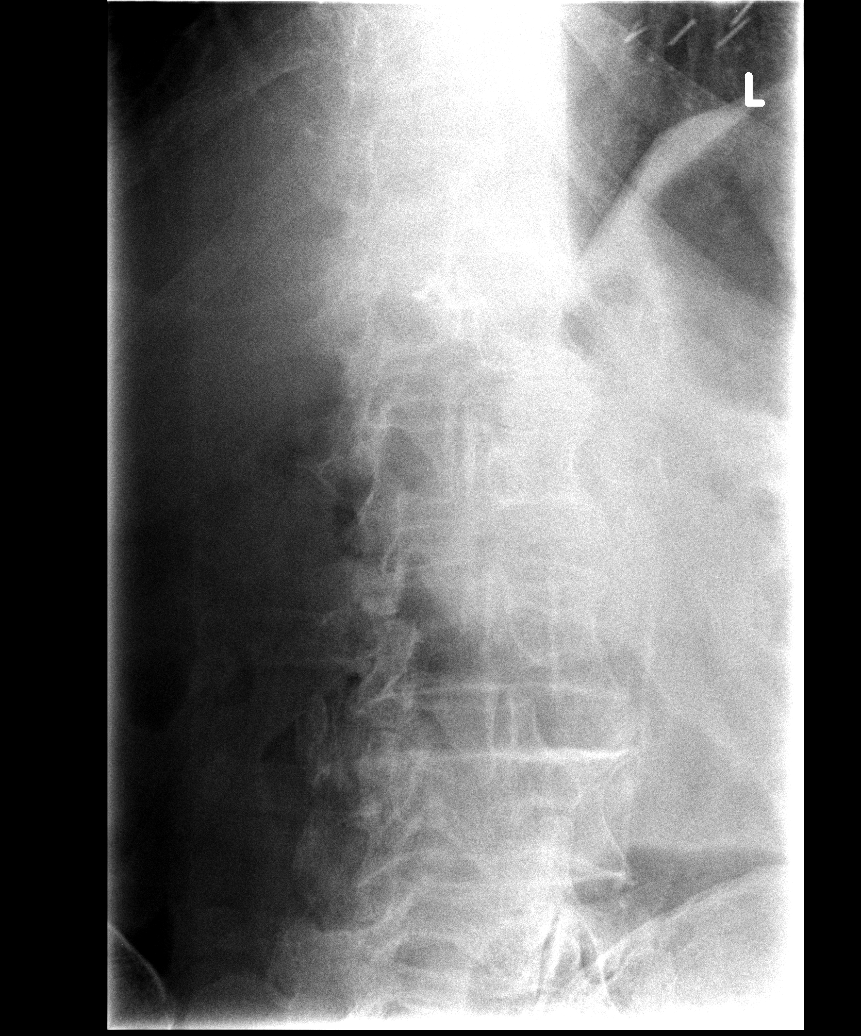

[view not recorded (4 of 5)]
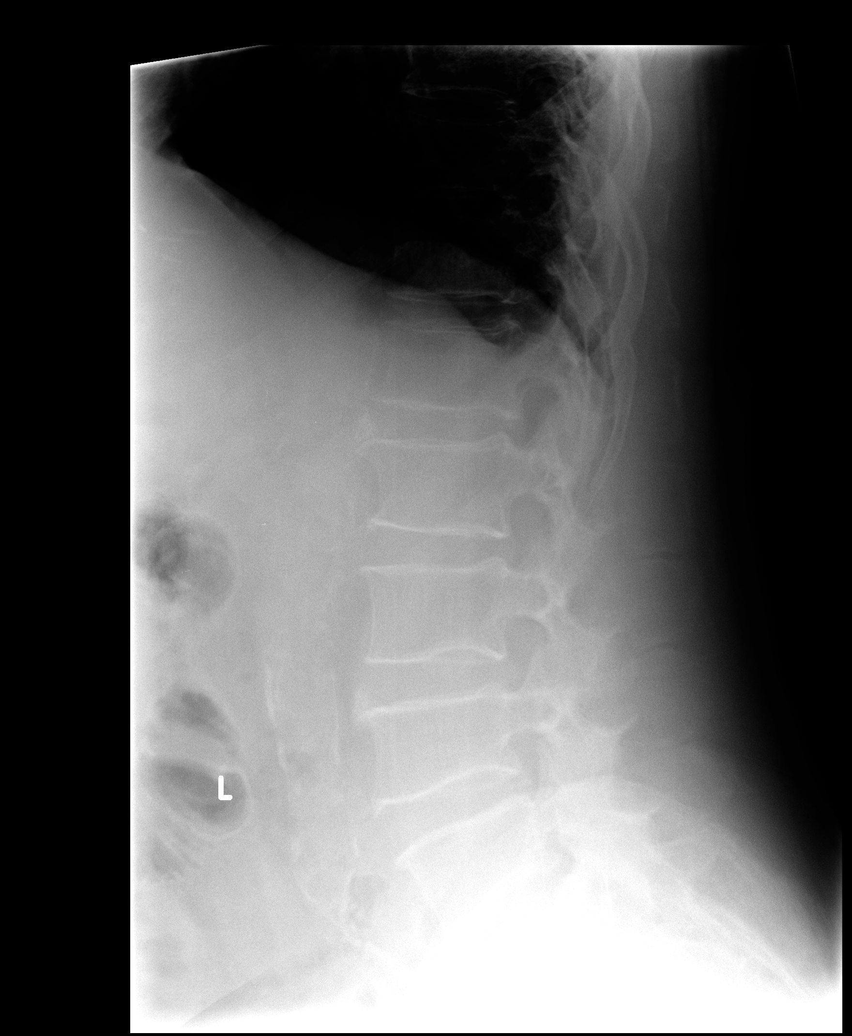

[view not recorded (5 of 5)]
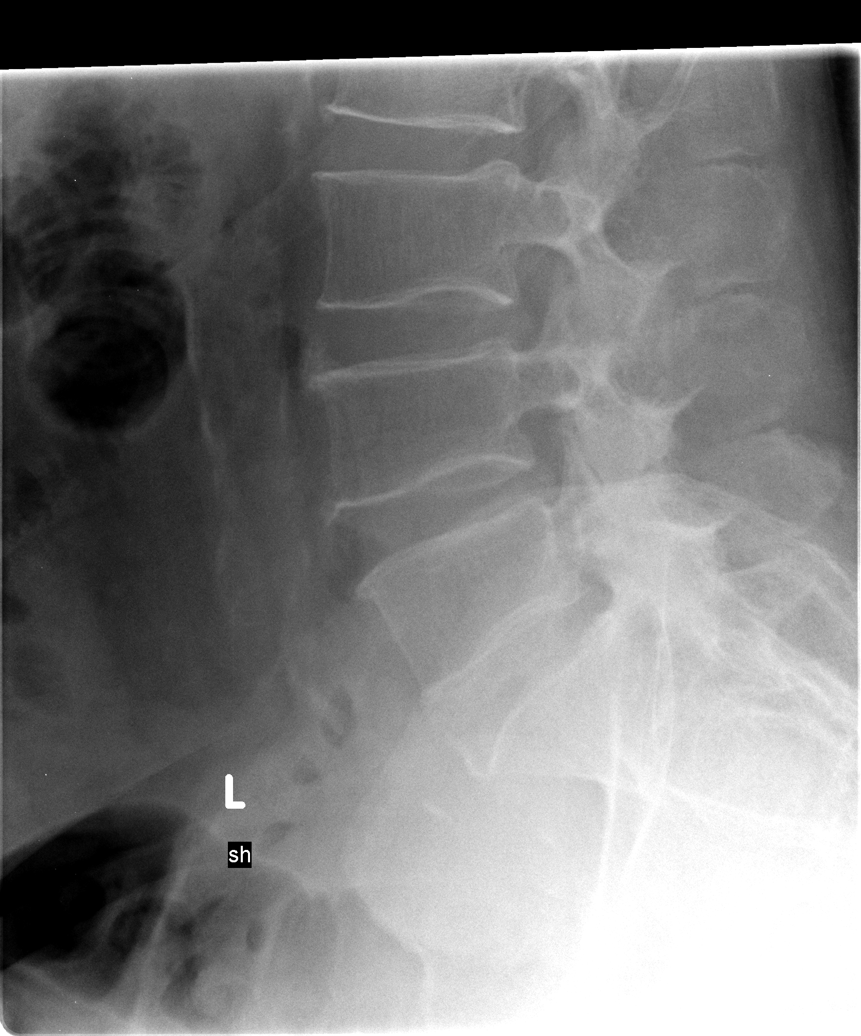

[5 of 5 positions shown; findings below may reference images not displayed]

FINDINGS: The bones appear demineralized. Given this and the patient's body
habitus, bone detail is somewhat limited. There are 5 lumbar type
vertebral bodies. The alignment is normal. The disc spaces are
preserved. There is mild facet hypertrophy inferiorly. There is no
evidence of fracture or pars defect. Aortoiliac atherosclerosis and
cholecystectomy clips noted.
IMPRESSION: No acute osseous findings or malalignment.

## 2016-11-07 ENCOUNTER — Other Ambulatory Visit: Payer: Self-pay | Admitting: Osteopathic Medicine

## 2016-11-07 DIAGNOSIS — G4700 Insomnia, unspecified: Secondary | ICD-10-CM

## 2016-11-07 DIAGNOSIS — G479 Sleep disorder, unspecified: Secondary | ICD-10-CM

## 2017-01-04 DIAGNOSIS — N401 Enlarged prostate with lower urinary tract symptoms: Secondary | ICD-10-CM | POA: Diagnosis not present

## 2017-01-04 DIAGNOSIS — R350 Frequency of micturition: Secondary | ICD-10-CM | POA: Diagnosis not present

## 2017-01-04 DIAGNOSIS — N138 Other obstructive and reflux uropathy: Secondary | ICD-10-CM | POA: Diagnosis not present

## 2017-01-05 ENCOUNTER — Encounter: Payer: Self-pay | Admitting: Osteopathic Medicine

## 2017-01-05 ENCOUNTER — Ambulatory Visit (INDEPENDENT_AMBULATORY_CARE_PROVIDER_SITE_OTHER): Payer: Medicare PPO | Admitting: Osteopathic Medicine

## 2017-01-05 VITALS — BP 142/73 | HR 72 | Ht 77.0 in | Wt 253.0 lb

## 2017-01-05 DIAGNOSIS — G4700 Insomnia, unspecified: Secondary | ICD-10-CM

## 2017-01-05 DIAGNOSIS — I10 Essential (primary) hypertension: Secondary | ICD-10-CM | POA: Diagnosis not present

## 2017-01-05 MED ORDER — ZOLPIDEM TARTRATE 10 MG PO TABS: 10.0000 mg | ORAL_TABLET | Freq: Every evening | ORAL | 3 refills | Status: DC | PRN

## 2017-01-05 MED ORDER — ZOLPIDEM TARTRATE 10 MG PO TABS: 10.0000 mg | ORAL_TABLET | Freq: Every evening | ORAL | 5 refills | Status: DC | PRN

## 2017-01-05 NOTE — Progress Notes (Signed)
HPI: Miguel Glenn is a 76 y.o. male  who presents to [facility] today, 01/05/17,  for chief complaint of:  Chief Complaint  Patient presents with  . Follow-up    INSOMNIA wants to increase dose of Ambien    Patient here for follow-up on insomnia issues.Has been doing all right on 5 mg of Ambien but over the past several weeks has not been helping whatsoever. Has been awake until 4 in the morning. Works as Naval architect, is stopping to rest appropriately but not getting enough sleep and then feeling significantly tired the next day.  Insomnia:   Initially, Clonazepam 2 mg plus trazodone 75 mg daily at bedtime. Previously on Ambien 10  Patient is working as a truck driver,had previous workup for sleep apnea negative except for difficulty going into REM sleep so his psychiatrist had put him on the above regimen and Dr Ivan Anchors was planning to continue it.   Our plan 06/17/16 was to wean down on the clonazepam that he is taking for sleep, increased trazodone w/ eventual plan to wean off of the clonazepam altogether.   As of 07/18/16 We were successful in weaning down and discontinuing clonazepam, increasing trazodone. Patient was not able to get Belsomra covered but tried the 15 mg dose and found this to be expensive but also caused daytime sedation.   Hypertension:  Well-controlled for the most part but has been out of medications recently, within able to get to the pharmacy to refill. No chest pain, pressure, shortness of breath.   BPH/urinary obstruction: Patient following with urology, they're planning for cystoscopy in the next few weeks, medication list updated.  Patient is accompanied by wife who assists with history   Past medical, surgical, social and family history reviewed: Past Medical History:  Diagnosis Date  . Asthma   . Bladder spasm   . CAD (coronary artery disease)    stents placed in [place] previously  . Cat allergies   .  Chronic pancreatitis (HCC)   . Colon polyps    hx  . DDD (degenerative disc disease), lumbar   . Depression with anxiety   . DJD of shoulder   . HLD (hyperlipidemia)   . HTN (hypertension)   . IFG (impaired fasting glucose)   . PUD (peptic ulcer disease)    gi bleed   Past Surgical History:  Procedure Laterality Date  . ABDOMINAL EXPLORATION SURGERY  1990s  . APPENDECTOMY    . CHOLECYSTECTOMY    . HIATAL HERNIA REPAIR  1991  . r shoulder replacement  12/10   Dr. Marlyne Beards  . RTC surgery bilat     Social History  Substance Use Topics  . Smoking status: Former Games developer  . Smokeless tobacco: Never Used     Comment: quit 1996  . Alcohol use No   Family History  Problem Relation Age of Onset  . Lung cancer Mother   . Hypertension Mother   . Hyperlipidemia Mother   . Hypertension Sister      Current medication list and allergy/intolerance information reviewed:   Current Outpatient Prescriptions  Medication Sig Dispense Refill  . alfuzosin (UROXATRAL) 10 MG 24 hr tablet Take 10 mg by mouth daily with breakfast.    . aspirin EC 81 MG tablet Take 1 tablet (81 mg total) by mouth daily. 90 tablet 3  . lisinopril (PRINIVIL,ZESTRIL) 40 MG tablet Take 1 tablet (40 mg total) by mouth daily. 90 tablet 3  . MULTIPLE VITAMIN  PO Take by mouth.      . pravastatin (PRAVACHOL) 80 MG tablet Take 1 tablet (80 mg total) by mouth daily. 90 tablet 3  . topiramate (TOPAMAX) 50 MG tablet Take 1 tablet (50 mg total) by mouth daily. 90 tablet 3  . zolpidem (AMBIEN) 10 MG tablet Take 1 tablet (10 mg total) by mouth at bedtime as needed for sleep. 30 tablet 5   No current facility-administered medications for this visit.    No Known Allergies    Review of Systems:  Constitutional:  No  fever, no chills, No recent illness.   HEENT: No  headache  Cardiac: No  chest pain  Respiratory:  No  shortness of breath.  Neurologic: No  weakness, No  dizzines  Psychiatric: No  concerns with  depression, No  concerns with anxiety, +sleep problems, No mood problems  Exam:  BP (!) 142/73   Pulse 72   Ht 6\' 5"  (1.956 m)   Wt 253 lb (114.8 kg)   BMI 30.00 kg/m   Constitutional: VS see above. General Appearance: alert, well-developed, well-nourished, NAD  Respiratory: Normal respiratory effort.  Musculoskeletal: Gait normal.   Neurological: Normal balance/coordination. No tremor.   Psychiatric: Normal judgment/insight. Normal mood and affect. Oriented x3.     ASSESSMENT/PLAN:   Insomnia, unspecified type - Okay to increase Ambien. Supplemental DOT form filled out, was honest about possible side effects of the medication - Plan: zolpidem (AMBIEN) 10 MG tablet, DISCONTINUED: zolpidem (AMBIEN) 10 MG tablet  Essential hypertension, benign - Previous blood pressure normal when taking medication regularly, no symptoms.     Visit summary with medication list and pertinent instructions was printed for patient to review. All questions at time of visit were answered - patient instructed to contact office with any additional concerns. ER/RTC precautions were reviewed with the patient. Follow-up plan: Return in about 6 months (around 07/08/2017) for ANNUAL PHYSICAL, SOONER IF NEEDED .   Total time spent: 25 minutes, greater than 50% of the visit was spent face-to-face counseling/coordinating care for the diagnoses of insomnia and essential hypertension, this included time spent completing DOT paperwork, see scanned documents.

## 2017-02-01 DIAGNOSIS — R350 Frequency of micturition: Secondary | ICD-10-CM | POA: Diagnosis not present

## 2017-02-01 DIAGNOSIS — N138 Other obstructive and reflux uropathy: Secondary | ICD-10-CM | POA: Diagnosis not present

## 2017-02-01 DIAGNOSIS — N401 Enlarged prostate with lower urinary tract symptoms: Secondary | ICD-10-CM | POA: Diagnosis not present

## 2017-02-14 ENCOUNTER — Encounter: Payer: Medicare PPO | Admitting: Osteopathic Medicine

## 2017-07-10 ENCOUNTER — Ambulatory Visit: Payer: Medicare PPO | Admitting: Osteopathic Medicine

## 2017-07-10 ENCOUNTER — Encounter: Payer: Self-pay | Admitting: Osteopathic Medicine

## 2017-07-10 VITALS — BP 140/69 | HR 61 | Ht 73.5 in | Wt 259.0 lb

## 2017-07-10 DIAGNOSIS — Z87891 Personal history of nicotine dependence: Secondary | ICD-10-CM | POA: Diagnosis not present

## 2017-07-10 DIAGNOSIS — E785 Hyperlipidemia, unspecified: Secondary | ICD-10-CM | POA: Diagnosis not present

## 2017-07-10 DIAGNOSIS — R51 Headache: Secondary | ICD-10-CM | POA: Diagnosis not present

## 2017-07-10 DIAGNOSIS — G4700 Insomnia, unspecified: Secondary | ICD-10-CM

## 2017-07-10 DIAGNOSIS — Z13820 Encounter for screening for osteoporosis: Secondary | ICD-10-CM | POA: Diagnosis not present

## 2017-07-10 DIAGNOSIS — Z1211 Encounter for screening for malignant neoplasm of colon: Secondary | ICD-10-CM

## 2017-07-10 DIAGNOSIS — I10 Essential (primary) hypertension: Secondary | ICD-10-CM

## 2017-07-10 DIAGNOSIS — Z23 Encounter for immunization: Secondary | ICD-10-CM | POA: Diagnosis not present

## 2017-07-10 DIAGNOSIS — Z0000 Encounter for general adult medical examination without abnormal findings: Secondary | ICD-10-CM | POA: Diagnosis not present

## 2017-07-10 DIAGNOSIS — R519 Headache, unspecified: Secondary | ICD-10-CM

## 2017-07-10 MED ORDER — TOPIRAMATE 50 MG PO TABS: 50.0000 mg | ORAL_TABLET | Freq: Every day | ORAL | 3 refills | Status: DC

## 2017-07-10 MED ORDER — ZOSTER VAC RECOMB ADJUVANTED 50 MCG/0.5ML IM SUSR: 0.5000 mL | Freq: Once | INTRAMUSCULAR | 1 refills | Status: AC

## 2017-07-10 MED ORDER — PRAVASTATIN SODIUM 80 MG PO TABS: 80.0000 mg | ORAL_TABLET | Freq: Every day | ORAL | 3 refills | Status: DC

## 2017-07-10 MED ORDER — LISINOPRIL 40 MG PO TABS: 40.0000 mg | ORAL_TABLET | Freq: Every day | ORAL | 3 refills | Status: DC

## 2017-07-10 MED ORDER — ALFUZOSIN HCL ER 10 MG PO TB24: 10.0000 mg | ORAL_TABLET | Freq: Every day | ORAL | 3 refills | Status: DC

## 2017-07-10 MED ORDER — ZOLPIDEM TARTRATE 10 MG PO TABS: 10.0000 mg | ORAL_TABLET | Freq: Every evening | ORAL | 5 refills | Status: DC | PRN

## 2017-07-10 NOTE — Progress Notes (Signed)
HPI: Miguel Glenn is a 76 y.o. male  who presents to [facility] today, 07/10/17,  for Medicare Annual Wellness Exam  Patient presents for annual physical/Medicare wellness exam.  Additional complaints today: insomnia seems to be getting worse despite max dose ambien. We successfully weaned off clonazepam. He's still working, driving trucks, needs a good night's rest w/o being groggy next morning.    Past medical, surgical, social and family history reviewed:  Patient Active Problem List   Diagnosis Date Noted  . Overactive bladder 12/15/2015  . Annual physical exam 11/19/2015  . Systolic murmur 11/19/2015  . Hereditary and idiopathic peripheral neuropathy 06/04/2015  . Lumbar radiculopathy 05/22/2015  . Occipital neuralgia 05/13/2015  . Cataract 11/12/2013  . Hearing loss 11/12/2013  . BPH (benign prostatic hyperplasia) 10/30/2012  . Bradycardia 10/30/2012  . CAD (coronary artery disease) 10/30/2012  . Hyperlipidemia LDL goal <70 10/30/2012  . Essential hypertension, benign 10/30/2012  . Chronic pancreatitis (HCC) 02/24/2010  . INSOMNIA 02/14/2010  . Depression 02/02/2010    Past Surgical History:  Procedure Laterality Date  . ABDOMINAL EXPLORATION SURGERY  1990s  . APPENDECTOMY    . CHOLECYSTECTOMY    . HIATAL HERNIA REPAIR  1991  . r shoulder replacement  12/10   Dr. Marlyne Beards  . RTC surgery bilat      Social History   Socioeconomic History  . Marital status: Married    Spouse name: Not on file  . Number of children: Not on file  . Years of education: Not on file  . Highest education level: Not on file  Social Needs  . Financial resource strain: Not on file  . Food insecurity - worry: Not on file  . Food insecurity - inability: Not on file  . Transportation needs - medical: Not on file  . Transportation needs - non-medical: Not on file  Occupational History  . Not on file  Tobacco Use  . Smoking status: Former Games developer   . Smokeless tobacco: Never Used  . Tobacco comment: quit 1996  Substance and Sexual Activity  . Alcohol use: No  . Drug use: Not on file  . Sexual activity: Not on file  Other Topics Concern  . Not on file  Social History Narrative   [facility] [employer]    Family History  Problem Relation Age of Onset  . Lung cancer Mother   . Hypertension Mother   . Hyperlipidemia Mother   . Hypertension Sister      Current medication list and allergy/intolerance information reviewed:    Outpatient Encounter Medications as of 07/10/2017  Medication Sig  . alfuzosin (UROXATRAL) 10 MG 24 hr tablet Take 10 mg by mouth daily with breakfast.  . aspirin EC 81 MG tablet Take 1 tablet (81 mg total) by mouth daily.  Marland Kitchen lisinopril (PRINIVIL,ZESTRIL) 40 MG tablet Take 1 tablet (40 mg total) by mouth daily.  . MULTIPLE VITAMIN PO Take by mouth.    . pravastatin (PRAVACHOL) 80 MG tablet Take 1 tablet (80 mg total) by mouth daily.  Marland Kitchen topiramate (TOPAMAX) 50 MG tablet Take 1 tablet (50 mg total) by mouth daily.  Marland Kitchen zolpidem (AMBIEN) 10 MG tablet Take 1 tablet (10 mg total) by mouth at bedtime as needed for sleep.   No facility-administered encounter medications on file as of 07/10/2017.     No Known Allergies     Review of Systems:  Constitutional:  No  fever, no chills, No recent illness.  HEENT: No  headache  Cardiac: No  chest pain  Respiratory:  No  shortness of breath.  Neurologic: No  weakness, No  dizzines  Psychiatric: No  concerns with depression, No  concerns with anxiety, +sleep problems, No mood problems   Medicare Wellness Questionnaire  Are there smokers in your home (other than you)? no  Depression Screen (Note: if answer to either of the following is "Yes", a more complete depression screening is indicated)   Q1: Over the past two weeks, have you felt down, depressed or hopeless? no  Q2: Over the past two weeks, have you felt little interest or pleasure in doing  things? no  Have you lost interest or pleasure in daily life? no  Do you often feel hopeless? no  Do you cry easily over simple problems? no  Activities of Daily Living In your present state of health, do you have any difficulty performing the following activities?:  Driving? no Managing money?  no Feeding yourself? no Getting from bed to chair? no Climbing a flight of stairs? no Preparing food and eating?: no Bathing or showering? no Getting dressed: no Getting to the toilet? no Using the toilet: no Moving around from place to place: no In the past year have you fallen or had a near fall?: no  Hearing Difficulties:  Do you often ask people to speak up or repeat themselves? no Do you experience ringing or noises in your ears? no  Do you have difficulty understanding soft or whispered voices? yes  Memory Difficulties:  Do you feel that you have a problem with memory? no  Do you often misplace items? no  Do you feel safe at home?  yes  Sexual Health:   Are you sexually active?  Yes  Do you have more than one partner?  No  Advanced Directives:   Advanced directives discussed: has an advanced directive - a copy HAS NOT been provided.  Additional information provided: no  Risk Factors  Current exercise habits: no formal program, he is fairly active   Dietary issues discussed: diet could be better  Cardiac risk factors: hypertension, hypercholesterolemia/hyperlipidemia, positive family history  NEUROLOGIST: Dr Loleta ChanceHill GI: Dr Noelle PennerGibbs    Exam:  BP 140/69   Pulse 61   Ht 6' 1.5" (1.867 m)   Wt 259 lb (117.5 kg)   BMI 33.71 kg/m   Constitutional: VS see above. General Appearance: alert, well-developed, well-nourished, NAD  Ears, Nose, Mouth, Throat: MMM  Neck: No masses, trachea midline.   Respiratory: Normal respiratory effort. no wheeze, no rhonchi, no rales  Cardiovascular:No lower extremity edema.   Musculoskeletal: Gait normal. No clubbing/cyanosis of digits.    Neurological: Normal balance/coordination. No tremor. Recalls 3 objects and able to read face of watch with correct time.   Skin: warm, dry, intact. No rash/ulcer.   Psychiatric: Normal judgment/insight. Normal mood and affect. Oriented x3.     ASSESSMENT/PLAN:   Encounter for Medicare annual wellness exam  Medicare annual wellness visit, subsequent  Essential hypertension, benign - Plan: lisinopril (PRINIVIL,ZESTRIL) 40 MG tablet, CBC, COMPLETE METABOLIC PANEL WITH GFR, Lipid panel, TSH, Hemoglobin A1c  Hyperlipidemia, unspecified hyperlipidemia type - Plan: pravastatin (PRAVACHOL) 80 MG tablet  Insomnia, unspecified type - Okay to continue Ambien rather than switch this. Additional info printed - Plan: zolpidem (AMBIEN) 10 MG tablet  Nonintractable headache, unspecified chronicity pattern, unspecified headache type - Plan: topiramate (TOPAMAX) 50 MG tablet  Former smoker - Plan: US ABDOMINAL AORTA SCREENING AAA  Screening  for osteoporosis - Plan: DG Bone Density  Screen for colon cancer - Plan: Ambulatory referral to Gastroenterology  Annual physical exam  CANCER SCREENING  Lung - does not need - quit 25 years ago   Colon - needs - Dr Noelle Penner at [facility]   Prostate - does not need  Breast - does not need  Cervical - does not need OTHER DISEASE SCREENING  Lipid - needs  DM2 - needs  AAA - male 25-75yo ever smoked - needs  Osteoporosis - women 74yo+, men 74yo+ - needs INFECTIOUS DISEASE SCREENING  HIV - does not need  GC/CT - does not need  HepC -does not need  TB - does not need ADULT VACCINATION  Influenza - annual vaccine recommended  Td - booster every 10 years - does not need  Zoster - option at 50, yes at 70+ - does not need  PCV13 - does not need  PPSV23 - does not need Immunization History  Administered Date(s) Administered  . Influenza Split 05/31/2011  . Influenza,inj,Quad PF,6+ Mos 11/12/2013, 06/17/2016  . Influenza-Unspecified  07/17/2015  . Pneumococcal Conjugate-13 07/17/2015  . Pneumococcal Polysaccharide-23 03/05/2009  . Td 09/05/2004  . Tdap 06/17/2016   OTHER  Fall - exercise and Vit D age 4+ - does not need  Advanced Directives -  Discussed as above   During the course of the visit the patient was educated and counseled about appropriate screening and preventive services as noted above.   Patient Instructions (the written plan) was given to the patient.  Medicare Attestation I have personally reviewed: The patient's medical and social history Their use of alcohol, tobacco or illicit drugs Their current medications and supplements The patient's functional ability including ADLs,fall risks, home safety risks, cognitive, and hearing and visual impairment Diet and physical activities Evidence for depression or mood disorders  The patient's weight, height, BMI, and visual acuity have been recorded in the chart.  I have made referrals, counseling, and provided education to the patient based on review of the above and I have provided the patient with a written personalized care plan for preventive services.     Sunnie Nielsen, DO   07/10/17   Visit summary with medication list and pertinent instructions was printed for patient to review. All questions at time of visit were answered - patient instructed to contact office with any additional concerns. ER/RTC precautions were reviewed with the patient. Follow-up plan: No Follow-up on file.

## 2017-07-11 ENCOUNTER — Encounter: Payer: Self-pay | Admitting: Osteopathic Medicine

## 2017-10-18 ENCOUNTER — Telehealth: Payer: Self-pay | Admitting: Osteopathic Medicine

## 2017-10-18 NOTE — Telephone Encounter (Signed)
Received PA sent through cover my meds. Awaiting confirmation.

## 2017-10-19 NOTE — Telephone Encounter (Signed)
Received fax confirmation that the patients Ambien has been approved from 10/19/2017 until 10/20/2019. Pharmacy notified.  Case ID: [id number].

## 2018-01-01 ENCOUNTER — Encounter: Payer: Self-pay | Admitting: Osteopathic Medicine

## 2018-01-01 ENCOUNTER — Ambulatory Visit: Payer: Medicare PPO | Admitting: Osteopathic Medicine

## 2018-01-01 DIAGNOSIS — G4700 Insomnia, unspecified: Secondary | ICD-10-CM

## 2018-01-01 DIAGNOSIS — I10 Essential (primary) hypertension: Secondary | ICD-10-CM | POA: Diagnosis not present

## 2018-01-01 MED ORDER — ZOLPIDEM TARTRATE 10 MG PO TABS: 10.0000 mg | ORAL_TABLET | Freq: Every evening | ORAL | 5 refills | Status: DC | PRN

## 2018-01-01 NOTE — Progress Notes (Signed)
HPI: Miguel Glenn is a 77 y.o. male  who presents to [facility] today, 01/01/18,  for chief complaint of:  Insomnia Blood pressure    Patient here for follow-up on insomnia issues. Works as Naval architect, is stopping to rest appropriately but without medications he was not getting enough sleep and then feeling significantly tired the next day, these symptoms have improved on Ambien 10 mg qhs. We have discussed risks/benefits of this medicine.   Insomnia:   Initially, Clonazepam 2 mg plus trazodone 75 mg daily at bedtime. Previously on Ambien 10  previous workup for sleep apnea negative except for difficulty going into REM sleep so his psychiatrist had put him on the above regimen   Our plan 06/17/16 was to wean down on the clonazepam that he was taking for sleep, increase trazodone w/ eventual plan to wean off of the clonazepam altogether.   As of 07/18/16 We were successful in weaning down and discontinuing clonazepam, increasing trazodone. Patient was not able to get Belsomra covered but tried the 15 mg dose and found this to be too expensive but also caused daytime sedation.   01/05/17 we decided to go back up to 10 mg Ambien and he has been stable on this    Hypertension:  Well-controlled for the most part but he has not taken his Lisinopril this morning. No chest pain, pressure, shortness of breath.    Patient is accompanied by wife who assists with history-taking.    Past medical, surgical, social and family history reviewed: Past Medical History:  Diagnosis Date  . Asthma   . Bladder spasm   . CAD (coronary artery disease)    stents placed in [place] previously  . Cat allergies   . Chronic pancreatitis (HCC)   . Colon polyps    hx  . DDD (degenerative disc disease), lumbar   . Depression with anxiety   . DJD of shoulder   . HLD (hyperlipidemia)   . HTN (hypertension)   . IFG (impaired fasting glucose)   . PUD (peptic  ulcer disease)    gi bleed   Past Surgical History:  Procedure Laterality Date  . ABDOMINAL EXPLORATION SURGERY  1990s  . APPENDECTOMY    . CHOLECYSTECTOMY    . HIATAL HERNIA REPAIR  1991  . r shoulder replacement  12/10   Dr. Marlyne Beards  . RTC surgery bilat     Social History   Tobacco Use  . Smoking status: Former Games developer  . Smokeless tobacco: Never Used  . Tobacco comment: quit 1996  Substance Use Topics  . Alcohol use: No   Family History  Problem Relation Age of Onset  . Lung cancer Mother   . Hypertension Mother   . Hyperlipidemia Mother   . Hypertension Sister      Current medication list and allergy/intolerance information reviewed:   Current Outpatient Medications  Medication Sig Dispense Refill  . alfuzosin (UROXATRAL) 10 MG 24 hr tablet Take 1 tablet (10 mg total) daily with breakfast by mouth. 90 tablet 3  . aspirin EC 81 MG tablet Take 1 tablet (81 mg total) by mouth daily. 90 tablet 3  . lisinopril (PRINIVIL,ZESTRIL) 40 MG tablet Take 1 tablet (40 mg total) daily by mouth. 90 tablet 3  . MULTIPLE VITAMIN PO Take by mouth.      . pravastatin (PRAVACHOL) 80 MG tablet Take 1 tablet (80 mg total) daily by mouth. 90 tablet 3  . topiramate (TOPAMAX) 50 MG  tablet Take 1 tablet (50 mg total) daily by mouth. 90 tablet 3  . zolpidem (AMBIEN) 10 MG tablet Take 1 tablet (10 mg total) at bedtime as needed by mouth for sleep. 30 tablet 5   No current facility-administered medications for this visit.    No Known Allergies    Review of Systems:  Constitutional:  No  fever, no chills, No recent illness.   HEENT: No  headache  Cardiac: No  chest pain  Respiratory:  No  shortness of breath.  Neurologic: No  weakness, No  dizzines  Psychiatric: No  concerns with depression, No  concerns with anxiety, +sleep problems, No mood problems  Exam:  BP 140/81 (BP Location: Left Arm, Patient Position: Sitting, Cuff Size: Normal)   Pulse (!) 52   Temp 97.8 F (36.6 C)  (Oral)   Wt 253 lb (114.8 kg)   BMI 32.93 kg/m   Constitutional: VS see above. General Appearance: alert, well-developed, well-nourished, NAD  Respiratory: Normal respiratory effort.  Musculoskeletal: Gait normal.   Neurological: Normal balance/coordination. No tremor.   Psychiatric: Normal judgment/insight. Normal mood and affect. Oriented x3.     ASSESSMENT/PLAN:   Essential hypertension, benign - take the medicines!   Insomnia, unspecified type - Plan: zolpidem (AMBIEN) 10 MG tablet      Visit summary with medication list and pertinent instructions was printed for patient to review. All questions at time of visit were answered - patient instructed to contact office with any additional concerns. ER/RTC precautions were reviewed with the patient. Follow-up plan: Return in about 6 months (around 07/03/2018) for [facility], sooner if needed .   Total time spent: 25 minutes, greater than 50% of the visit was spent face-to-face counseling/coordinating care for the diagnoses of insomnia and essential hypertension, this included time spent completing DOT paperwork, see scanned documents.

## 2018-05-11 ENCOUNTER — Telehealth: Payer: Self-pay

## 2018-05-11 NOTE — Telephone Encounter (Signed)
[facility] pharmacy located in [place], [place] called requesting ambien rx. 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 Pt is requesting to p/u his rx early. Pharmacist states last rx was on 04/15/18. Pt would like to p/u ambien rx this weekend. Pharmacy can be contacted at [phone]. Pls advise. Thanks.

## 2018-05-14 NOTE — Telephone Encounter (Signed)
Obviously, this did not happen I was out of the office when this request was received.  Can we call the patient and see if he is still in need of this refill, if we need to send it to anywhere other than his usual pharmacy, I can do that.

## 2018-05-14 NOTE — Telephone Encounter (Signed)
As per pt, was able to get ambien rx from [place] pharmacy. He was requesting rx one week earlier from due date & was able to get the rx since pt is a regular customer at the pharmacy. No other inquiries during call.

## 2018-06-11 DIAGNOSIS — I10 Essential (primary) hypertension: Secondary | ICD-10-CM | POA: Diagnosis not present

## 2018-06-11 DIAGNOSIS — E785 Hyperlipidemia, unspecified: Secondary | ICD-10-CM | POA: Diagnosis not present

## 2018-06-11 DIAGNOSIS — Z955 Presence of coronary angioplasty implant and graft: Secondary | ICD-10-CM | POA: Diagnosis not present

## 2018-06-11 DIAGNOSIS — I2510 Atherosclerotic heart disease of native coronary artery without angina pectoris: Secondary | ICD-10-CM | POA: Diagnosis not present

## 2018-06-13 DIAGNOSIS — Z955 Presence of coronary angioplasty implant and graft: Secondary | ICD-10-CM | POA: Diagnosis not present

## 2018-06-13 DIAGNOSIS — I2510 Atherosclerotic heart disease of native coronary artery without angina pectoris: Secondary | ICD-10-CM | POA: Diagnosis not present

## 2018-06-13 DIAGNOSIS — E785 Hyperlipidemia, unspecified: Secondary | ICD-10-CM | POA: Diagnosis not present

## 2018-06-13 DIAGNOSIS — I10 Essential (primary) hypertension: Secondary | ICD-10-CM | POA: Diagnosis not present

## 2018-06-18 DIAGNOSIS — I10 Essential (primary) hypertension: Secondary | ICD-10-CM | POA: Diagnosis not present

## 2018-06-18 DIAGNOSIS — I471 Supraventricular tachycardia: Secondary | ICD-10-CM | POA: Diagnosis not present

## 2018-06-26 DIAGNOSIS — I2510 Atherosclerotic heart disease of native coronary artery without angina pectoris: Secondary | ICD-10-CM | POA: Diagnosis not present

## 2018-06-26 DIAGNOSIS — E785 Hyperlipidemia, unspecified: Secondary | ICD-10-CM | POA: Diagnosis not present

## 2018-06-26 DIAGNOSIS — Z8711 Personal history of peptic ulcer disease: Secondary | ICD-10-CM | POA: Diagnosis not present

## 2018-06-26 DIAGNOSIS — I471 Supraventricular tachycardia: Secondary | ICD-10-CM | POA: Diagnosis not present

## 2018-06-26 DIAGNOSIS — F329 Major depressive disorder, single episode, unspecified: Secondary | ICD-10-CM | POA: Diagnosis not present

## 2018-06-26 DIAGNOSIS — Z955 Presence of coronary angioplasty implant and graft: Secondary | ICD-10-CM | POA: Diagnosis not present

## 2018-06-26 DIAGNOSIS — I129 Hypertensive chronic kidney disease with stage 1 through stage 4 chronic kidney disease, or unspecified chronic kidney disease: Secondary | ICD-10-CM | POA: Diagnosis not present

## 2018-06-26 DIAGNOSIS — N183 Chronic kidney disease, stage 3 (moderate): Secondary | ICD-10-CM | POA: Diagnosis not present

## 2018-06-26 DIAGNOSIS — Z87891 Personal history of nicotine dependence: Secondary | ICD-10-CM | POA: Diagnosis not present

## 2018-07-11 NOTE — Progress Notes (Signed)
Subjective:   Miguel Glenn is a 77 y.o. male who presents for Medicare Annual/Subsequent preventive examination.  Review of Systems:  No ROS.  Medicare Wellness Visit. Additional risk factors are reflected in the social history.  Cardiac Risk Factors include: dyslipidemia;advanced age (>65men, >32 women);male gender  Sleep patterns: Getting about 8-9 hours of sleep a night when not working. Wakes up sometimes during night 1 time to use the restroom.   Home Safety/Smoke Alarms: Feels safe in home. Smoke alarms in place.  Living environment; Lives with wife in 2 story home. Handrails in place. Shower is a walk in shower with grab bars in place as well as a seat in place.      Male:   CCS- utd   PSA- utd Lab Results  Component Value Date   PSA 2.03 08/13/2014   PSA 0.93 11/12/2012   PSA 0.74 10/29/2010        Objective:    Vitals: BP 125/73   Pulse (!) 54   Ht 6\' 2"  (1.88 m)   Wt 239 lb (108.4 kg)   BMI 30.69 kg/m   Body mass index is 30.69 kg/m.  Advanced Directives 07/18/2018  Does Patient Have a Medical Advance Directive? Yes  Type of Estate agent of [place];Living will  Does patient want to make changes to medical advance directive? No - Patient declined  Copy of Healthcare Power of Attorney in Chart? No - copy requested    Tobacco Social History   Tobacco Use  Smoking Status Former Smoker  Smokeless Tobacco Never Used  Tobacco Comment   quit 1996     Counseling given: Not Answered Comment: quit 1996   Clinical Intake:  Pre-visit preparation completed: Yes  Pain : No/denies pain     Nutritional Risks: None Diabetes: No  How often do you need to have someone help you when you read instructions, pamphlets, or other written materials from your doctor or pharmacy?: 1 - Never What is the last grade level you completed in school?: 12  Interpreter Needed?: No  Information entered by :: Carolin Sicks, LPN  Past Medical  History:  Diagnosis Date  . Asthma   . Bladder spasm   . CAD (coronary artery disease)    stents placed in [place] previously  . Cat allergies   . Chronic pancreatitis (HCC)   . Colon polyps    hx  . DDD (degenerative disc disease), lumbar   . Depression with anxiety   . DJD of shoulder   . HLD (hyperlipidemia)   . HTN (hypertension)   . IFG (impaired fasting glucose)   . PUD (peptic ulcer disease)    gi bleed   Past Surgical History:  Procedure Laterality Date  . ABDOMINAL EXPLORATION SURGERY  1990s  . APPENDECTOMY    . CHOLECYSTECTOMY    . HIATAL HERNIA REPAIR  1991  . r shoulder replacement  12/10   Dr. Marlyne Beards  . RTC surgery bilat     Family History  Problem Relation Age of Onset  . Lung cancer Mother   . Hypertension Mother   . Hyperlipidemia Mother   . Hypertension Sister    Social History   Socioeconomic History  . Marital status: Married    Spouse name: Lea  . Number of children: 0  . Years of education: 38  . Highest education level: GED or equivalent  Occupational History  . Occupation: truck Public librarian Needs  . Financial resource strain: Not hard  at all  . Food insecurity:    Worry: Never true    Inability: Never true  . Transportation needs:    Medical: No    Non-medical: No  Tobacco Use  . Smoking status: Former Games developer  . Smokeless tobacco: Never Used  . Tobacco comment: quit 1996  Substance and Sexual Activity  . Alcohol use: No  . Drug use: Never  . Sexual activity: Yes  Lifestyle  . Physical activity:    Days per week: 0 days    Minutes per session: 0 min  . Stress: Not at all  Relationships  . Social connections:    Talks on phone: More than three times a week    Gets together: Once a week    Attends religious service: More than 4 times per year    Active member of club or organization: No    Attends meetings of clubs or organizations: Never    Relationship status: Married  Other Topics Concern  . 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    Outpatient Encounter Medications as of 07/18/2018  Medication Sig  . alfuzosin (UROXATRAL) 10 MG 24 hr tablet Take 1 tablet (10 mg total) by mouth daily with breakfast.  . aspirin EC 81 MG tablet Take 1 tablet (81 mg total) by mouth daily.  Marland Kitchen lisinopril (PRINIVIL,ZESTRIL) 40 MG tablet Take 1 tablet (40 mg total) by mouth daily.  . MULTIPLE VITAMIN PO Take by mouth.    . pravastatin (PRAVACHOL) 80 MG tablet Take 1 tablet (80 mg total) by mouth daily.  . sildenafil (REVATIO) 20 MG tablet Take 1-4 tablets (20-80 mg total) by mouth as needed (prior to sex).  . topiramate (TOPAMAX) 50 MG tablet Take 1 tablet (50 mg total) by mouth daily.  Marland Kitchen zolpidem (AMBIEN) 10 MG tablet Take 1 tablet (10 mg total) by mouth at bedtime as needed for sleep.  . [DISCONTINUED] alfuzosin (UROXATRAL) 10 MG 24 hr tablet Take 1 tablet (10 mg total) daily with breakfast by mouth.  . [DISCONTINUED] aspirin EC 81 MG tablet Take 1 tablet (81 mg total) by mouth daily.  . [DISCONTINUED] lisinopril (PRINIVIL,ZESTRIL) 40 MG tablet Take 1 tablet (40 mg total) daily by mouth.  . [DISCONTINUED] pravastatin (PRAVACHOL) 80 MG tablet Take 1 tablet (80 mg total) daily by mouth.  . [DISCONTINUED] topiramate (TOPAMAX) 50 MG tablet Take 1 tablet (50 mg total) daily by mouth.  . [DISCONTINUED] zolpidem (AMBIEN) 10 MG tablet Take 1 tablet (10 mg total) by mouth at bedtime as needed for sleep.   No facility-administered encounter medications on file as of 07/18/2018.     Activities of Daily Living In your present state of health, do you have any difficulty performing the following activities: 07/18/2018  Hearing? N  Vision? N  Difficulty concentrating or making decisions? N  Walking or climbing stairs? N  Dressing or bathing? N  Doing errands, shopping? N  Preparing Food and eating ? N  Using the Toilet? N  In the past six months, have you  accidently leaked urine? N  Do you have problems with loss of bowel control? N  Managing your Medications? N  Managing your Finances? N  Housekeeping or managing your Housekeeping? N  Some recent data might be hidden    Patient Care Team: Sunnie Nielsen, DO as PCP - General (Osteopathic Medicine)   Assessment:   This is a routine  wellness examination for Miguel Glenn.Physical assessment deferred to PCP.   Exercise Activities and Dietary recommendations Current Exercise Habits: The patient does not participate in regular exercise at present, Exercise limited by: cardiac condition(s) Diet eats somewhat healthy. Drives truck. Does eat bananas. Breakfast:doesn't eat breakfast Lunch: sandwich Dinner: meat and vegetable or soups. Doesn't drink water at all. Advised pt needed to start drinking water.       Goals   None     Fall Risk Fall Risk  07/18/2018 11/12/2013  Falls in the past year? 0 No   Is the patient's home free of loose throw rugs in walkways, pet beds, electrical cords, etc?   yes      Grab bars in the bathroom? yes      Handrails on the stairs?   yes      Adequate lighting?   yes   Depression Screen PHQ 2/9 Scores 07/18/2018 07/18/2018 07/18/2016 11/12/2013  PHQ - 2 Score 0 0 0 0  PHQ- 9 Score - - 2 -    Cognitive Function     6CIT Screen 07/18/2018 07/10/2017  What Year? 0 points 0 points  What month? 0 points 0 points  What time? 0 points 0 points  Count back from 20 0 points 0 points  Months in reverse 2 points 0 points  Repeat phrase 0 points 4 points  Total Score 2 4    Immunization History  Administered Date(s) Administered  . Influenza Split 05/31/2011  . Influenza, High Dose Seasonal PF 07/10/2017, 06/28/2018  . Influenza,inj,Quad PF,6+ Mos 11/12/2013, 06/17/2016  . Influenza-Unspecified 07/17/2015  . Pneumococcal Conjugate-13 07/17/2015  . Pneumococcal Polysaccharide-23 03/05/2009  . Td 09/05/2004  . Tdap 06/17/2016    Screening  Tests Health Maintenance  Topic Date Due  . INFLUENZA VACCINE  04/05/2018  . COLONOSCOPY  02/07/2021  . TETANUS/TDAP  06/17/2026  . PNA vac Low Risk Adult  Completed        Plan:    Please schedule your next medicare wellness visit with me in 1 yr.  Miguel Glenn , Thank you for taking time to come for your Medicare Wellness Visit. I appreciate your ongoing commitment to your health goals. Please review the following plan we discussed and let me know if I can assist you in the future.  Bring a copy of your living will and/or healthcare power of attorney to your next office visit.    These are the goals we discussed: Goals   None     This is a list of the screening recommended for you and due dates:  Health Maintenance  Topic Date Due  . Flu Shot  04/05/2018  . Colon Cancer Screening  02/07/2021  . Tetanus Vaccine  06/17/2026  . Pneumonia vaccines  Completed     I have personally reviewed and noted the following in the patient's chart:   . Medical and social history . Use of alcohol, tobacco or illicit drugs  . Current medications and supplements . Functional ability and status . Nutritional status . Physical activity . Advanced directives . List of other physicians . Hospitalizations, surgeries, and ER visits in previous 12 months . Vitals . Screenings to include cognitive, depression, and falls . Referrals and appointments  In addition, I have reviewed and discussed with patient certain preventive protocols, quality metrics, and best practice recommendations. A written personalized care plan for preventive services as well as general preventive health recommendations were provided to patient.     Normand Sloop,  LPN  [id number]

## 2018-07-18 ENCOUNTER — Ambulatory Visit: Payer: Medicare PPO | Admitting: Osteopathic Medicine

## 2018-07-18 ENCOUNTER — Encounter: Payer: Medicare PPO | Admitting: Osteopathic Medicine

## 2018-07-18 ENCOUNTER — Encounter: Payer: Self-pay | Admitting: Osteopathic Medicine

## 2018-07-18 ENCOUNTER — Ambulatory Visit: Payer: Medicare PPO | Admitting: *Deleted

## 2018-07-18 VITALS — BP 125/73 | HR 54 | Temp 97.8°F | Wt 239.4 lb

## 2018-07-18 VITALS — BP 125/73 | HR 54 | Ht 74.0 in | Wt 239.0 lb

## 2018-07-18 DIAGNOSIS — F5101 Primary insomnia: Secondary | ICD-10-CM | POA: Diagnosis not present

## 2018-07-18 DIAGNOSIS — I2510 Atherosclerotic heart disease of native coronary artery without angina pectoris: Secondary | ICD-10-CM | POA: Diagnosis not present

## 2018-07-18 DIAGNOSIS — R51 Headache: Secondary | ICD-10-CM | POA: Diagnosis not present

## 2018-07-18 DIAGNOSIS — G4700 Insomnia, unspecified: Secondary | ICD-10-CM

## 2018-07-18 DIAGNOSIS — I10 Essential (primary) hypertension: Secondary | ICD-10-CM | POA: Diagnosis not present

## 2018-07-18 DIAGNOSIS — N3281 Overactive bladder: Secondary | ICD-10-CM

## 2018-07-18 DIAGNOSIS — Z0000 Encounter for general adult medical examination without abnormal findings: Secondary | ICD-10-CM

## 2018-07-18 DIAGNOSIS — Z0001 Encounter for general adult medical examination with abnormal findings: Secondary | ICD-10-CM

## 2018-07-18 DIAGNOSIS — N400 Enlarged prostate without lower urinary tract symptoms: Secondary | ICD-10-CM | POA: Diagnosis not present

## 2018-07-18 DIAGNOSIS — E785 Hyperlipidemia, unspecified: Secondary | ICD-10-CM | POA: Diagnosis not present

## 2018-07-18 DIAGNOSIS — N529 Male erectile dysfunction, unspecified: Secondary | ICD-10-CM | POA: Diagnosis not present

## 2018-07-18 DIAGNOSIS — R519 Headache, unspecified: Secondary | ICD-10-CM

## 2018-07-18 MED ORDER — TOPIRAMATE 50 MG PO TABS: 50.0000 mg | ORAL_TABLET | Freq: Every day | ORAL | 3 refills | Status: DC

## 2018-07-18 MED ORDER — ZOLPIDEM TARTRATE 10 MG PO TABS: 10.0000 mg | ORAL_TABLET | Freq: Every evening | ORAL | 1 refills | Status: DC | PRN

## 2018-07-18 MED ORDER — ASPIRIN EC 81 MG PO TBEC: 81.0000 mg | DELAYED_RELEASE_TABLET | Freq: Every day | ORAL | 3 refills | Status: DC

## 2018-07-18 MED ORDER — PRAVASTATIN SODIUM 80 MG PO TABS: 80.0000 mg | ORAL_TABLET | Freq: Every day | ORAL | 3 refills | Status: DC

## 2018-07-18 MED ORDER — SILDENAFIL CITRATE 20 MG PO TABS: 20.0000 mg | ORAL_TABLET | ORAL | 11 refills | Status: DC | PRN

## 2018-07-18 MED ORDER — LISINOPRIL 40 MG PO TABS: 40.0000 mg | ORAL_TABLET | Freq: Every day | ORAL | 3 refills | Status: DC

## 2018-07-18 MED ORDER — ALFUZOSIN HCL ER 10 MG PO TB24: 10.0000 mg | ORAL_TABLET | Freq: Every day | ORAL | 3 refills | Status: DC

## 2018-07-18 NOTE — Patient Instructions (Signed)
Please schedule your next medicare wellness visit with me in 1 yr.  [name] , Thank you for taking time to come for your Medicare Wellness Visit. I appreciate your ongoing commitment to your health goals. Please review the following plan we discussed and let me know if I can assist you in the future.  Bring a copy of your living will and/or healthcare power of attorney to your next office visit.

## 2018-07-19 ENCOUNTER — Encounter: Payer: Self-pay | Admitting: Osteopathic Medicine

## 2018-07-19 NOTE — Progress Notes (Signed)
HPI: Miguel Glenn is a 77 y.o. male who  has a past medical history of Asthma, Bladder spasm, CAD (coronary artery disease), Cat allergies, Chronic pancreatitis (HCC), Colon polyps, DDD (degenerative disc disease), lumbar, Depression with anxiety, DJD of shoulder, HLD (hyperlipidemia), HTN (hypertension), IFG (impaired fasting glucose), and PUD (peptic ulcer disease).  he presents to [facility] today, 07/19/18,  for chief complaint of:  Medication refills -see below for individual headings  Insomnia: Doing well on Ambien, has been on this for quite some time without any problematic side effects.  Erectile dysfunction: New problem, patient would like to try something for this, has never been on therapy for this issue in the past.  History of headache, prophylactic topiramate seems to be working really well for him and he would like to continue this medication.  Still working and would like to continue doing so   CAD: following w/ cardiology     Past medical history, surgical history, and family history reviewed.  Current medication list and allergy/intolerance information reviewed.   (See remainder of HPI, ROS, Phys Exam below)        ASSESSMENT/PLAN:   Essential hypertension, benign - Plan: CBC, COMPLETE METABOLIC PANEL WITH GFR, Lipid panel, TSH, Hemoglobin A1c, lisinopril (PRINIVIL,ZESTRIL) 40 MG tablet  Primary insomnia - Plan: CBC, COMPLETE METABOLIC PANEL WITH GFR, Lipid panel, TSH, Hemoglobin A1c  Erectile dysfunction, unspecified erectile dysfunction type - Plan: sildenafil (REVATIO) 20 MG tablet  Coronary artery disease involving native coronary artery of native heart without angina pectoris - Plan: aspirin EC 81 MG tablet  Hyperlipidemia, unspecified hyperlipidemia type - Plan: pravastatin (PRAVACHOL) 80 MG tablet  Nonintractable headache, unspecified chronicity pattern, unspecified headache type - Plan: topiramate (TOPAMAX) 50  MG tablet  Insomnia, unspecified type - Plan: zolpidem (AMBIEN) 10 MG tablet  Overactive bladder - Plan: alfuzosin (UROXATRAL) 10 MG 24 hr tablet  Benign prostatic hyperplasia, unspecified whether lower urinary tract symptoms present - Plan: alfuzosin (UROXATRAL) 10 MG 24 hr tablet   Meds ordered this encounter  Medications  . alfuzosin (UROXATRAL) 10 MG 24 hr tablet    Sig: Take 1 tablet (10 mg total) by mouth daily with breakfast.    Dispense:  90 tablet    Refill:  3  . aspirin EC 81 MG tablet    Sig: Take 1 tablet (81 mg total) by mouth daily.    Dispense:  90 tablet    Refill:  3  . lisinopril (PRINIVIL,ZESTRIL) 40 MG tablet    Sig: Take 1 tablet (40 mg total) by mouth daily.    Dispense:  90 tablet    Refill:  3  . pravastatin (PRAVACHOL) 80 MG tablet    Sig: Take 1 tablet (80 mg total) by mouth daily.    Dispense:  90 tablet    Refill:  3  . topiramate (TOPAMAX) 50 MG tablet    Sig: Take 1 tablet (50 mg total) by mouth daily.    Dispense:  90 tablet    Refill:  3  . zolpidem (AMBIEN) 10 MG tablet    Sig: Take 1 tablet (10 mg total) by mouth at bedtime as needed for sleep.    Dispense:  90 tablet    Refill:  1  . sildenafil (REVATIO) 20 MG tablet    Sig: Take 1-4 tablets (20-80 mg total) by mouth as needed (prior to sex).    Dispense:  50 tablet    Refill:  11  Follow-up plan: Return in about 6 months (around 01/16/2019), or sooner if needed, for medication maintenance for ambien.                            ############################################ ############################################ ############################################ ############################################    Outpatient Encounter Medications as of 07/18/2018  Medication Sig  . alfuzosin (UROXATRAL) 10 MG 24 hr tablet Take 1 tablet (10 mg total) by mouth daily with breakfast.  . aspirin EC 81 MG tablet Take 1 tablet (81 mg total) by mouth daily.  Marland Kitchen  lisinopril (PRINIVIL,ZESTRIL) 40 MG tablet Take 1 tablet (40 mg total) by mouth daily.  . MULTIPLE VITAMIN PO Take by mouth.    . pravastatin (PRAVACHOL) 80 MG tablet Take 1 tablet (80 mg total) by mouth daily.  Marland Kitchen topiramate (TOPAMAX) 50 MG tablet Take 1 tablet (50 mg total) by mouth daily.  Marland Kitchen zolpidem (AMBIEN) 10 MG tablet Take 1 tablet (10 mg total) by mouth at bedtime as needed for sleep.  . [DISCONTINUED] alfuzosin (UROXATRAL) 10 MG 24 hr tablet Take 1 tablet (10 mg total) daily with breakfast by mouth.  . [DISCONTINUED] aspirin EC 81 MG tablet Take 1 tablet (81 mg total) by mouth daily.  . [DISCONTINUED] lisinopril (PRINIVIL,ZESTRIL) 40 MG tablet Take 1 tablet (40 mg total) daily by mouth.  . [DISCONTINUED] pravastatin (PRAVACHOL) 80 MG tablet Take 1 tablet (80 mg total) daily by mouth.  . [DISCONTINUED] topiramate (TOPAMAX) 50 MG tablet Take 1 tablet (50 mg total) daily by mouth.  . [DISCONTINUED] zolpidem (AMBIEN) 10 MG tablet Take 1 tablet (10 mg total) by mouth at bedtime as needed for sleep.  . sildenafil (REVATIO) 20 MG tablet Take 1-4 tablets (20-80 mg total) by mouth as needed (prior to sex).   No facility-administered encounter medications on file as of 07/18/2018.    No Known Allergies    Review of Systems:  Constitutional: No recent illness  HEENT: No  headache, no vision change  Cardiac: No  chest pain, No  pressure, No palpitations  Respiratory:  No  shortness of breath. No  Cough  Gastrointestinal: No  abdominal pain, no change on bowel habits  Musculoskeletal: No new myalgia/arthralgia  Skin: No  Rash  Hem/Onc: No  easy bruising/bleeding, No  abnormal lumps/bumps  Neurologic: No  weakness, No  Dizziness  Psychiatric: No  concerns with depression, No  concerns with anxiety  Exam:  BP 125/73 (BP Location: Left Arm, Patient Position: Sitting, Cuff Size: Normal)   Pulse (!) 54   Temp 97.8 F (36.6 C) (Oral)   Wt 239 lb 6.4 oz (108.6 kg)   BMI 31.16 kg/m    Constitutional: VS see above. General Appearance: alert, well-developed, well-nourished, NAD  Eyes: Normal lids and conjunctive, non-icteric sclera  Ears, Nose, Mouth, Throat: MMM, Normal external inspection ears/nares/mouth/lips/gums.  Neck: No masses, trachea midline.   Respiratory: Normal respiratory effort. no wheeze, no rhonchi, no rales  Cardiovascular: S1/S2 normal, no murmur, no rub/gallop auscultated. RRR.   Musculoskeletal: Gait normal. Symmetric and independent movement of all extremities  Neurological: Normal balance/coordination. No tremor.  Skin: warm, dry, intact.   Psychiatric: Normal judgment/insight. Normal mood and affect. Oriented x3.   Visit summary with medication list and pertinent instructions was printed for patient to review, advised to alert [place] if any changes needed. All questions at time of visit were answered - patient instructed to contact office with any additional concerns. ER/RTC precautions were reviewed with the  patient and understanding verbalized.   Follow-up plan: Return in about 6 months (around 01/16/2019), or sooner if needed, for medication maintenance for ambien.  Note: Total time spent 25 minutes, greater than 50% of the visit was spent face-to-face counseling and coordinating care for the following: The primary encounter diagnosis was Essential hypertension, benign. Diagnoses of Primary insomnia, Erectile dysfunction, unspecified erectile dysfunction type, Coronary artery disease involving native coronary artery of native heart without angina pectoris, Hyperlipidemia, unspecified hyperlipidemia type, Nonintractable headache, unspecified chronicity pattern, unspecified headache type, Insomnia, unspecified type, Overactive bladder, and Benign prostatic hyperplasia, unspecified whether lower urinary tract symptoms present were also pertinent to this visit.Marland Kitchen.  Please note: voice recognition software was used to produce this document, and typos may  escape review. Please contact Dr. Lyn HollingsheadAlexander for any needed clarifications.

## 2018-08-12 DIAGNOSIS — Z955 Presence of coronary angioplasty implant and graft: Secondary | ICD-10-CM | POA: Diagnosis not present

## 2018-08-12 DIAGNOSIS — Z7982 Long term (current) use of aspirin: Secondary | ICD-10-CM | POA: Diagnosis not present

## 2018-08-12 DIAGNOSIS — F329 Major depressive disorder, single episode, unspecified: Secondary | ICD-10-CM | POA: Diagnosis not present

## 2018-08-12 DIAGNOSIS — I2510 Atherosclerotic heart disease of native coronary artery without angina pectoris: Secondary | ICD-10-CM | POA: Diagnosis not present

## 2018-08-12 DIAGNOSIS — F99 Mental disorder, not otherwise specified: Secondary | ICD-10-CM | POA: Diagnosis not present

## 2018-08-12 DIAGNOSIS — Z87891 Personal history of nicotine dependence: Secondary | ICD-10-CM | POA: Diagnosis not present

## 2018-08-12 DIAGNOSIS — R6883 Chills (without fever): Secondary | ICD-10-CM | POA: Diagnosis not present

## 2018-08-12 DIAGNOSIS — R112 Nausea with vomiting, unspecified: Secondary | ICD-10-CM | POA: Diagnosis not present

## 2018-08-12 DIAGNOSIS — Z79899 Other long term (current) drug therapy: Secondary | ICD-10-CM | POA: Diagnosis not present

## 2018-08-12 DIAGNOSIS — I10 Essential (primary) hypertension: Secondary | ICD-10-CM | POA: Diagnosis not present

## 2018-08-12 MED ORDER — SODIUM CHLORIDE 0.9 % IV SOLN
10.00 | INTRAVENOUS | Status: DC
Start: ? — End: 2018-08-12

## 2018-08-12 MED ORDER — GENERIC EXTERNAL MEDICATION
10.00 | Status: DC
Start: ? — End: 2018-08-12

## 2018-09-07 DIAGNOSIS — Z955 Presence of coronary angioplasty implant and graft: Secondary | ICD-10-CM | POA: Diagnosis not present

## 2018-09-07 DIAGNOSIS — Z79899 Other long term (current) drug therapy: Secondary | ICD-10-CM | POA: Diagnosis not present

## 2018-09-07 DIAGNOSIS — G44009 Cluster headache syndrome, unspecified, not intractable: Secondary | ICD-10-CM | POA: Diagnosis not present

## 2018-09-07 DIAGNOSIS — R9082 White matter disease, unspecified: Secondary | ICD-10-CM | POA: Diagnosis not present

## 2018-09-07 DIAGNOSIS — N183 Chronic kidney disease, stage 3 (moderate): Secondary | ICD-10-CM | POA: Diagnosis not present

## 2018-09-07 DIAGNOSIS — E785 Hyperlipidemia, unspecified: Secondary | ICD-10-CM | POA: Diagnosis not present

## 2018-09-07 DIAGNOSIS — R0781 Pleurodynia: Secondary | ICD-10-CM | POA: Diagnosis not present

## 2018-09-07 DIAGNOSIS — R260 Ataxic gait: Secondary | ICD-10-CM | POA: Diagnosis not present

## 2018-09-07 DIAGNOSIS — I10 Essential (primary) hypertension: Secondary | ICD-10-CM | POA: Diagnosis not present

## 2018-09-07 DIAGNOSIS — I639 Cerebral infarction, unspecified: Secondary | ICD-10-CM | POA: Diagnosis not present

## 2018-09-07 DIAGNOSIS — R29818 Other symptoms and signs involving the nervous system: Secondary | ICD-10-CM | POA: Diagnosis not present

## 2018-09-07 DIAGNOSIS — R221 Localized swelling, mass and lump, neck: Secondary | ICD-10-CM | POA: Diagnosis not present

## 2018-09-07 DIAGNOSIS — I6523 Occlusion and stenosis of bilateral carotid arteries: Secondary | ICD-10-CM | POA: Diagnosis not present

## 2018-09-07 DIAGNOSIS — R42 Dizziness and giddiness: Secondary | ICD-10-CM | POA: Diagnosis not present

## 2018-09-07 DIAGNOSIS — Z7982 Long term (current) use of aspirin: Secondary | ICD-10-CM | POA: Diagnosis not present

## 2018-09-07 DIAGNOSIS — I2510 Atherosclerotic heart disease of native coronary artery without angina pectoris: Secondary | ICD-10-CM | POA: Diagnosis not present

## 2018-09-07 DIAGNOSIS — Z96611 Presence of right artificial shoulder joint: Secondary | ICD-10-CM | POA: Diagnosis not present

## 2018-09-07 DIAGNOSIS — I77810 Thoracic aortic ectasia: Secondary | ICD-10-CM | POA: Diagnosis not present

## 2018-09-07 DIAGNOSIS — G441 Vascular headache, not elsewhere classified: Secondary | ICD-10-CM | POA: Diagnosis not present

## 2018-09-07 DIAGNOSIS — R51 Headache: Secondary | ICD-10-CM | POA: Diagnosis not present

## 2018-09-07 DIAGNOSIS — G44001 Cluster headache syndrome, unspecified, intractable: Secondary | ICD-10-CM | POA: Diagnosis not present

## 2018-09-07 DIAGNOSIS — R270 Ataxia, unspecified: Secondary | ICD-10-CM | POA: Diagnosis not present

## 2018-09-07 DIAGNOSIS — Z87891 Personal history of nicotine dependence: Secondary | ICD-10-CM | POA: Diagnosis not present

## 2018-09-07 DIAGNOSIS — G459 Transient cerebral ischemic attack, unspecified: Secondary | ICD-10-CM | POA: Diagnosis not present

## 2018-09-07 DIAGNOSIS — R29701 NIHSS score 1: Secondary | ICD-10-CM | POA: Diagnosis not present

## 2018-09-07 DIAGNOSIS — I519 Heart disease, unspecified: Secondary | ICD-10-CM | POA: Diagnosis not present

## 2018-09-07 DIAGNOSIS — J8410 Pulmonary fibrosis, unspecified: Secondary | ICD-10-CM | POA: Diagnosis not present

## 2018-09-07 DIAGNOSIS — I517 Cardiomegaly: Secondary | ICD-10-CM | POA: Diagnosis not present

## 2018-09-10 MED ORDER — GENERIC EXTERNAL MEDICATION
650.00 | Status: DC
Start: ? — End: 2018-09-10

## 2018-09-10 MED ORDER — DOCUSATE SODIUM 100 MG PO CAPS
100.00 | ORAL_CAPSULE | ORAL | Status: DC
Start: 2018-09-09 — End: 2018-09-10

## 2018-09-10 MED ORDER — SODIUM CHLORIDE 0.9 % IV SOLN
20.00 | INTRAVENOUS | Status: DC
Start: ? — End: 2018-09-10

## 2018-09-10 MED ORDER — MAGNESIUM CITRATE PO SOLN
150.00 | ORAL | Status: DC
Start: ? — End: 2018-09-10

## 2018-09-10 MED ORDER — TOPIRAMATE 25 MG PO TABS
50.00 | ORAL_TABLET | ORAL | Status: DC
Start: 2018-09-10 — End: 2018-09-10

## 2018-09-10 MED ORDER — ATORVASTATIN CALCIUM 20 MG PO TABS
20.00 | ORAL_TABLET | ORAL | Status: DC
Start: 2018-09-10 — End: 2018-09-10

## 2018-09-10 MED ORDER — GENERIC EXTERNAL MEDICATION
10.00 | Status: DC
Start: ? — End: 2018-09-10

## 2018-09-10 MED ORDER — HYDRALAZINE HCL 20 MG/ML IJ SOLN
10.00 | INTRAMUSCULAR | Status: DC
Start: ? — End: 2018-09-10

## 2018-09-10 MED ORDER — TAMSULOSIN HCL 0.4 MG PO CAPS
0.40 | ORAL_CAPSULE | ORAL | Status: DC
Start: 2018-09-10 — End: 2018-09-10

## 2018-09-10 MED ORDER — ONDANSETRON HCL 4 MG/2ML IJ SOLN
4.00 | INTRAMUSCULAR | Status: DC
Start: ? — End: 2018-09-10

## 2018-09-10 MED ORDER — MUPIROCIN 2 % EX OINT
TOPICAL_OINTMENT | CUTANEOUS | Status: DC
Start: 2018-09-09 — End: 2018-09-10

## 2018-09-10 MED ORDER — LABETALOL HCL 5 MG/ML IV SOLN
10.00 | INTRAVENOUS | Status: DC
Start: ? — End: 2018-09-10

## 2018-09-10 MED ORDER — SODIUM CHLORIDE 0.9 % IV SOLN
10.00 | INTRAVENOUS | Status: DC
Start: ? — End: 2018-09-10

## 2018-09-10 MED ORDER — HYDROCODONE-ACETAMINOPHEN 5-325 MG PO TABS
1.00 | ORAL_TABLET | ORAL | Status: DC
Start: ? — End: 2018-09-10

## 2018-09-10 MED ORDER — ASPIRIN 325 MG PO TABS
325.00 | ORAL_TABLET | ORAL | Status: DC
Start: 2018-09-10 — End: 2018-09-10

## 2018-09-10 MED ORDER — LISINOPRIL 20 MG PO TABS
40.00 | ORAL_TABLET | ORAL | Status: DC
Start: 2018-09-10 — End: 2018-09-10

## 2018-09-10 MED ORDER — HEPARIN SODIUM (PORCINE) 5000 UNIT/ML IJ SOLN
5000.00 | INTRAMUSCULAR | Status: DC
Start: 2018-09-09 — End: 2018-09-10

## 2018-09-26 DIAGNOSIS — I639 Cerebral infarction, unspecified: Secondary | ICD-10-CM | POA: Diagnosis not present

## 2018-09-26 DIAGNOSIS — E7800 Pure hypercholesterolemia, unspecified: Secondary | ICD-10-CM | POA: Diagnosis not present

## 2018-09-26 DIAGNOSIS — I10 Essential (primary) hypertension: Secondary | ICD-10-CM | POA: Diagnosis not present

## 2018-10-01 ENCOUNTER — Ambulatory Visit: Payer: Medicare PPO | Admitting: Osteopathic Medicine

## 2018-10-01 ENCOUNTER — Encounter: Payer: Self-pay | Admitting: Osteopathic Medicine

## 2018-10-01 VITALS — BP 131/78 | HR 57 | Temp 97.8°F | Wt 241.8 lb

## 2018-10-01 DIAGNOSIS — I639 Cerebral infarction, unspecified: Secondary | ICD-10-CM

## 2018-10-01 DIAGNOSIS — K118 Other diseases of salivary glands: Secondary | ICD-10-CM | POA: Diagnosis not present

## 2018-10-01 MED ORDER — ATORVASTATIN CALCIUM 40 MG PO TABS: 40.0000 mg | ORAL_TABLET | Freq: Every day | ORAL | 3 refills | Status: DC

## 2018-10-01 MED ORDER — ASPIRIN 325 MG PO TABS: 325.0000 mg | ORAL_TABLET | Freq: Once | ORAL | 3 refills | Status: DC

## 2018-10-01 MED ORDER — TRAZODONE HCL 50 MG PO TABS: 50.0000 mg | ORAL_TABLET | Freq: Every evening | ORAL | 1 refills | Status: DC | PRN

## 2018-10-01 MED ORDER — ASPIRIN 325 MG PO TABS: 325.0000 mg | ORAL_TABLET | Freq: Every day | ORAL | 3 refills | Status: DC

## 2018-10-01 NOTE — Progress Notes (Signed)
HPI: Miguel Glenn is a 78 y.o. male who  has a past medical history of Asthma, Bladder spasm, CAD (coronary artery disease), Cat allergies, Chronic pancreatitis (HCC), Colon polyps, DDD (degenerative disc disease), lumbar, Depression with anxiety, DJD of shoulder, HLD (hyperlipidemia), HTN (hypertension), IFG (impaired fasting glucose), and PUD (peptic ulcer disease).  he presents to [facility] today, 10/01/18,  for chief complaint of: Hospital follow-up   Stroke - received TPA after presentation to the ER w/ severe headache, L leg ataxia. MRI was clear. Followed up with neurology, seen 09/26/18.  Patient is feeling great today. No concerns. 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   L parotid mass noted incidental finding.   CT Code Stroke Head Neck Angio1/11/2018 [facility] Result Impression  IMPRESSION: 1. Mild atherosclerotic disease at both carotid bifurcations without a significant stenosis. 2. Vertebral arteries are patent without any significant stenosis, occlusion or aneurysm.  3. Intracranial vessels are unremarkable. 4. 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   MRI Head WO IV Contrast1/11/2018 [facility] Result Impression  IMPRESSION: No evidence of acute intracranial abnormality.  Electronically Signed by: [place] Holder  Result Narrative  MRI BRAIN WITHOUT CONTRAST  INDICATION: Headache, left leg ataxia, gait ataxia.  TECHNIQUE: Multiplanar, multisequence MR imaging of the brain without IV contrast.  COMPARISON: Head CT without contrast and CTA of the head and neck, earlier same date (09/07/2018). MRI brain without and with contrast 05/19/2017.  FINDINGS: No significant interval change.  No abnormal brain parenchymal signal.  Incidental left hippocampal cysts, as before.  Diffusion imaging shows no hyperacute, acute, or early subacute infarction.  No intracranial mass or mass  effect.  No hydrocephalus.  Normal signal voids in the larger intracranial vessels.  The marrow signal pattern is within normal limits.  Partially-imaged heterogeneous mass in the tail the left parotid gland, as before.  Other Result Information  Acute Interface, Incoming Rad Results - 09/07/2018  6:02 PM EST MRI BRAIN WITHOUT CONTRAST  INDICATION: Headache, left leg ataxia, gait ataxia.  TECHNIQUE: Multiplanar, multisequence MR imaging of the brain without IV contrast.  COMPARISON: Head CT without contrast and CTA of the head and neck, earlier same date (09/07/2018). MRI brain without and with contrast 05/19/2017.  FINDINGS: No significant interval change.  No abnormal brain parenchymal signal.  Incidental left hippocampal cysts, as before.  Diffusion imaging shows no hyperacute, acute, or early subacute infarction.  No intracranial mass or mass effect.  No hydrocephalus.  Normal signal voids in the larger intracranial vessels.  The marrow signal pattern is within normal limits.  Partially-imaged heterogeneous mass in the tail the left parotid gland, as before.   IMPRESSION: No evidence of acute intracranial abnormality.  Electronically Signed by: [place] Holder        Past medical, surgical, social and family history reviewed:  Patient Active Problem List   Diagnosis Date Noted  . Overactive bladder 12/15/2015  . Annual physical exam 11/19/2015  . Systolic murmur 11/19/2015  . Hereditary and idiopathic peripheral neuropathy 06/04/2015  . Lumbar radiculopathy 05/22/2015  . Occipital neuralgia 05/13/2015  . Cataract 11/12/2013  . Hearing loss 11/12/2013  . BPH (benign prostatic hyperplasia) 10/30/2012  . Bradycardia 10/30/2012  . CAD (coronary artery disease) 10/30/2012  . Hyperlipidemia LDL goal <70 10/30/2012  . Essential hypertension, benign 10/30/2012  . Chronic pancreatitis (HCC) 02/24/2010  . INSOMNIA 02/14/2010  . Depression 02/02/2010  Past  Surgical History:  Procedure Laterality Date  . ABDOMINAL EXPLORATION SURGERY  1990s  . APPENDECTOMY    . CHOLECYSTECTOMY    . HIATAL HERNIA REPAIR  1991  . r shoulder replacement  12/10   Dr. Marlyne BeardsJennings  . RTC surgery bilat      Social History   Tobacco Use  . Smoking status: Former Games developermoker  . Smokeless tobacco: Never Used  . Tobacco comment: quit 1996  Substance Use Topics  . Alcohol use: No    Family History  Problem Relation Age of Onset  . Lung cancer Mother   . Hypertension Mother   . Hyperlipidemia Mother   . Hypertension Sister      Current medication list and allergy/intolerance information reviewed:    Current Outpatient Medications  Medication Sig Dispense Refill  . alfuzosin (UROXATRAL) 10 MG 24 hr tablet Take 1 tablet (10 mg total) by mouth daily with breakfast. 90 tablet 3  . aspirin 325 MG tablet Take by mouth.    Marland Kitchen. atorvastatin (LIPITOR) 40 MG tablet Take by mouth.    Marland Kitchen. lisinopril (PRINIVIL,ZESTRIL) 40 MG tablet Take 1 tablet (40 mg total) by mouth daily. 90 tablet 3  . MULTIPLE VITAMIN PO Take by mouth.      . sildenafil (REVATIO) 20 MG tablet Take 1-4 tablets (20-80 mg total) by mouth as needed (prior to sex). 50 tablet 11  . topiramate (TOPAMAX) 50 MG tablet Take 1 tablet (50 mg total) by mouth daily. 90 tablet 3  . zolpidem (AMBIEN) 10 MG tablet Take 1 tablet (10 mg total) by mouth at bedtime as needed for sleep. 90 tablet 1  . aspirin EC 81 MG tablet Take 1 tablet (81 mg total) by mouth daily. (Patient not taking: Reported on 10/01/2018) 90 tablet 3  . pravastatin (PRAVACHOL) 80 MG tablet Take 1 tablet (80 mg total) by mouth daily. (Patient not taking: Reported on 10/01/2018) 90 tablet 3   No current facility-administered medications for this visit.     No Known Allergies    Review of Systems:  Constitutional:  No  fever, no chills  HEENT: No  headache, no vision change, no hearing change, No sore throat, No  sinus pressure  Cardiac: No  chest  pain, No  pressure, No palpitations, No  Orthopnea  Respiratory:  No  shortness of breath. No  Cough  Gastrointestinal: No  abdominal pain, No  nausea  Musculoskeletal: No new myalgia/arthralgia  Skin: No  Rash  Genitourinary: No  incontinence  Neurologic: No  weakness, No  dizziness, No  slurred speech/focal weakness/facial droop  Psychiatric: No  concerns with depression, No  concerns with anxiety  Exam:  BP 131/78 (BP Location: Left Arm, Patient Position: Sitting, Cuff Size: Normal)   Pulse (!) 57   Temp 97.8 F (36.6 C) (Oral)   Wt 241 lb 12.8 oz (109.7 kg)   BMI 31.05 kg/m   Constitutional: VS see above. General Appearance: alert, well-developed, well-nourished, NAD  Eyes: Normal lids and conjunctive, non-icteric sclera  Ears, Nose, Mouth, Throat: MMM, Normal external inspection ears/nares/mouth/lips/gums.  Neck: No masses, trachea midline. No thyroid enlargement. No tenderness/mass appreciated. No lymphadenopathy  Respiratory: Normal respiratory effort. no wheeze, no rhonchi, no rales  Cardiovascular: S1/S2 normal, no murmur, no rub/gallop auscultated. RRR. No lower extremity edema.   Musculoskeletal: Gait normal.   Skin: warm, dry, intact. No rash/ulcer.  Psychiatric: Normal judgment/insight. Normal mood and affect. Oriented x3.       ASSESSMENT/PLAN: The primary  encounter diagnosis was Cerebrovascular accident (CVA), unspecified mechanism (HCC). A diagnosis of Parotid mass was also pertinent to this visit.   Orders Placed This Encounter  Procedures  . Ambulatory referral to Interventional Radiology   Will likely need FNA under CT or US guidance to evaluate parotid mass and determine need for possible surgical intervention    Meds ordered this encounter  Medications  . DISCONTD: aspirin 325 MG tablet    Sig: Take 1 tablet (325 mg total) by mouth once for 1 dose.    Dispense:  90 tablet    Refill:  3  . atorvastatin (LIPITOR) 40 MG tablet    Sig:  Take 1 tablet (40 mg total) by mouth daily.    Dispense:  90 tablet    Refill:  3  . traZODone (DESYREL) 50 MG tablet    Sig: Take 1-2 tablets (50-100 mg total) by mouth at bedtime as needed for sleep.    Dispense:  30 tablet    Refill:  1  . aspirin 325 MG tablet    Sig: Take 1 tablet (325 mg total) by mouth daily.    Dispense:  90 tablet    Refill:  3         Visit summary with medication list and pertinent instructions was printed for patient to review. All questions at time of visit were answered - patient instructed to contact office with any additional concerns or updates. ER/RTC precautions were reviewed with the patient.     Please note: voice recognition software was used to produce this document, and typos may escape review. Please contact Dr. Lyn HollingsheadAlexander for any needed clarifications.     Follow-up plan: Return in about 3 months (around 12/31/2018) [facility], sooner if needed.

## 2018-10-01 NOTE — Patient Instructions (Signed)
Plan: Will send for biopsy or surgery depending on radiologist recommendations - will call you.

## 2018-10-03 DIAGNOSIS — K118 Other diseases of salivary glands: Secondary | ICD-10-CM | POA: Insufficient documentation

## 2018-10-03 DIAGNOSIS — I639 Cerebral infarction, unspecified: Secondary | ICD-10-CM | POA: Insufficient documentation

## 2018-10-16 DIAGNOSIS — I493 Ventricular premature depolarization: Secondary | ICD-10-CM | POA: Diagnosis not present

## 2018-10-16 DIAGNOSIS — I491 Atrial premature depolarization: Secondary | ICD-10-CM | POA: Diagnosis not present

## 2018-10-16 DIAGNOSIS — I442 Atrioventricular block, complete: Secondary | ICD-10-CM | POA: Diagnosis not present

## 2018-11-12 DIAGNOSIS — E7800 Pure hypercholesterolemia, unspecified: Secondary | ICD-10-CM | POA: Diagnosis not present

## 2018-11-12 DIAGNOSIS — G459 Transient cerebral ischemic attack, unspecified: Secondary | ICD-10-CM | POA: Diagnosis not present

## 2018-11-12 DIAGNOSIS — I10 Essential (primary) hypertension: Secondary | ICD-10-CM | POA: Diagnosis not present

## 2018-11-27 ENCOUNTER — Other Ambulatory Visit: Payer: Self-pay | Admitting: Osteopathic Medicine

## 2019-01-10 ENCOUNTER — Other Ambulatory Visit: Payer: Self-pay | Admitting: Osteopathic Medicine

## 2019-01-10 DIAGNOSIS — G4700 Insomnia, unspecified: Secondary | ICD-10-CM

## 2019-01-11 MED ORDER — ZOLPIDEM TARTRATE 10 MG PO TABS: 10.0000 mg | ORAL_TABLET | Freq: Every evening | ORAL | 0 refills | Status: DC | PRN

## 2019-01-11 NOTE — Addendum Note (Signed)
Addended by: Deirdre Pippins on: 01/11/2019 03:02 PM   Modules accepted: Orders

## 2019-01-16 ENCOUNTER — Ambulatory Visit (INDEPENDENT_AMBULATORY_CARE_PROVIDER_SITE_OTHER): Payer: Medicare PPO | Admitting: Osteopathic Medicine

## 2019-01-16 ENCOUNTER — Encounter: Payer: Self-pay | Admitting: Osteopathic Medicine

## 2019-01-16 DIAGNOSIS — G4700 Insomnia, unspecified: Secondary | ICD-10-CM | POA: Diagnosis not present

## 2019-01-16 MED ORDER — ZOLPIDEM TARTRATE 10 MG PO TABS: 10.0000 mg | ORAL_TABLET | Freq: Every evening | ORAL | 1 refills | Status: DC | PRN

## 2019-01-16 MED ORDER — TRAZODONE HCL 50 MG PO TABS: 50.0000 mg | ORAL_TABLET | Freq: Every evening | ORAL | 0 refills | Status: DC | PRN

## 2019-01-16 NOTE — Progress Notes (Signed)
Virtual Visit via Phone Note  I connected with      Miguel ClamRobert G Trigo on 01/16/19 at 10:00 by a telemedicine application and verified that I am speaking with the correct person using two identifiers.  Patient is at home I am in office   I discussed the limitations of evaluation and management by telemedicine and the availability of in person appointments. The patient expressed understanding and agreed to proceed.  History of Present Illness: Miguel Glenn is a 78 y.o. male who would like to discuss medication refills.  Patient has been on Ambien for many years, works as a Naval architecttruck driver, never had any issues.  Needs to sleep at night so that he can be well rested in the morning for his job.  He is also taking trazodone on occasion as needed, may be a couple of times per week.  Recent diagnosis TIA, following with neurology, compliant with medications as noted below including high-dose aspirin and Lipitor.  Labs reviewed from hospitalization 09/09/2018, all looks okay including cholesterol panel.       Observations/Objective: BP 133/80   Pulse (!) 56   Temp 98 F (36.7 C) (Oral)   Wt 241 lb (109.3 kg)   BMI 30.94 kg/m  BP Readings from Last 3 Encounters:  01/16/19 133/80  10/01/18 131/78  07/18/18 125/73   Exam: Normal Speech.  NAD  Lab and Radiology Results No results found for this or any previous visit (from the past 72 hour(s)). No results found.     Assessment and Plan: 78 y.o. male with The encounter diagnosis was Insomnia, unspecified type.   PDMP not reviewed this encounter. No orders of the defined types were placed in this encounter.  Meds ordered this encounter  Medications  . zolpidem (AMBIEN) 10 MG tablet    Sig: Take 1 tablet (10 mg total) by mouth at bedtime as needed. for sleep    Dispense:  90 tablet    Refill:  1  . traZODone (DESYREL) 50 MG tablet    Sig: Take 1-2 tablets (50-100 mg total) by mouth at bedtime as needed. for sleep   Dispense:  90 tablet    Refill:  0    Instructions sent via MyChart. If MyChart not available, pt was given option for info via personal e-mail w/ no guarantee of protected health info over unsecured e-mail communication, and MyChart sign-up instructions were included.   Follow Up Instructions: Return in about 6 months (around 07/19/2019) for annual physical, sooner if needed .    I discussed the assessment and treatment plan with the patient. The patient was provided an opportunity to ask questions and all were answered. The patient agreed with the plan and demonstrated an understanding of the instructions.   The patient was advised to call back or seek an in-person evaluation if any new concerns, if symptoms worsen or if the condition fails to improve as anticipated.  15 minutes of non-face-to-face time was provided during this encounter.                      Historical information moved to improve visibility of documentation.  Past Medical History:  Diagnosis Date  . Asthma   . Bladder spasm   . CAD (coronary artery disease)    stents placed in [place] previously  . Cat allergies   . Chronic pancreatitis (HCC)   . Colon polyps    hx  . DDD (degenerative disc disease), lumbar   .  Depression with anxiety   . DJD of shoulder   . HLD (hyperlipidemia)   . HTN (hypertension)   . IFG (impaired fasting glucose)   . PUD (peptic ulcer disease)    gi bleed   Past Surgical History:  Procedure Laterality Date  . ABDOMINAL EXPLORATION SURGERY  1990s  . APPENDECTOMY    . CHOLECYSTECTOMY    . HIATAL HERNIA REPAIR  1991  . r shoulder replacement  12/10   Dr. Marlyne Beards  . RTC surgery bilat     Social History   Tobacco Use  . Smoking status: Former Games developer  . Smokeless tobacco: Never Used  . Tobacco comment: quit 1996  Substance Use Topics  . Alcohol use: No   family history includes Hyperlipidemia in his mother; Hypertension in his mother and sister; Lung cancer in  his mother.  Medications: Current Outpatient Medications  Medication Sig Dispense Refill  . alfuzosin (UROXATRAL) 10 MG 24 hr tablet Take 1 tablet (10 mg total) by mouth daily with breakfast. 90 tablet 3  . aspirin 325 MG tablet Take 1 tablet (325 mg total) by mouth daily. 90 tablet 3  . atorvastatin (LIPITOR) 40 MG tablet Take 1 tablet (40 mg total) by mouth daily. 90 tablet 3  . lisinopril (PRINIVIL,ZESTRIL) 40 MG tablet Take 1 tablet (40 mg total) by mouth daily. 90 tablet 3  . MULTIPLE VITAMIN PO Take by mouth.      . sildenafil (REVATIO) 20 MG tablet Take 1-4 tablets (20-80 mg total) by mouth as needed (prior to sex). 50 tablet 11  . topiramate (TOPAMAX) 50 MG tablet Take 1 tablet (50 mg total) by mouth daily. 90 tablet 3  . traZODone (DESYREL) 50 MG tablet Take 1-2 tablets (50-100 mg total) by mouth at bedtime as needed. for sleep 90 tablet 0  . zolpidem (AMBIEN) 10 MG tablet Take 1 tablet (10 mg total) by mouth at bedtime as needed. for sleep 90 tablet 1   No current facility-administered medications for this visit.    No Known Allergies  PDMP not reviewed this encounter. No orders of the defined types were placed in this encounter.  Meds ordered this encounter  Medications  . zolpidem (AMBIEN) 10 MG tablet    Sig: Take 1 tablet (10 mg total) by mouth at bedtime as needed. for sleep    Dispense:  90 tablet    Refill:  1  . traZODone (DESYREL) 50 MG tablet    Sig: Take 1-2 tablets (50-100 mg total) by mouth at bedtime as needed. for sleep    Dispense:  90 tablet    Refill:  0

## 2019-06-11 ENCOUNTER — Other Ambulatory Visit: Payer: Self-pay | Admitting: Osteopathic Medicine

## 2019-06-12 NOTE — Telephone Encounter (Signed)
Requested medication (s) are due for refill today: yes  Requested medication (s) are on the active medication list: yes  Last refill:  02/24/2019  Future visit scheduled:no  Notes to clinic:  Overdue for office visit  Review for refill   Requested Prescriptions  Pending Prescriptions Disp Refills   traZODone (DESYREL) 50 MG tablet [Pharmacy Med Name: traZODone HCl 50 MG Oral Tablet] 90 tablet 0    Sig: TAKE 1 TO 2 TABLETS BY MOUTH AT BEDTIME AS NEEDED FOR SLEEP     Psychiatry: Antidepressants - Serotonin Modulator Failed - 06/11/2019  7:37 PM      Failed - Valid encounter within last 6 months    Recent Outpatient Visits          4 months ago Insomnia, unspecified type   [place] [facility], Lanelle Bal, DO   8 months ago Cerebrovascular accident (CVA), unspecified mechanism ([place])   [place] [facility], Lanelle Bal, DO   10 months ago Essential hypertension, benign   [place] [facility], Lanelle Bal, DO   1 year ago Essential hypertension, benign   [place] [facility], Lanelle Bal, DO   1 year ago Medicare annual wellness visit, subsequent   [place], Lanelle Bal, DO      Future Appointments            In 1 month [place] - Completed PHQ-2 or PHQ-9 in the last 360 days.

## 2019-07-05 DIAGNOSIS — R062 Wheezing: Secondary | ICD-10-CM | POA: Diagnosis not present

## 2019-07-05 DIAGNOSIS — M549 Dorsalgia, unspecified: Secondary | ICD-10-CM | POA: Diagnosis not present

## 2019-07-05 DIAGNOSIS — R0602 Shortness of breath: Secondary | ICD-10-CM | POA: Diagnosis not present

## 2019-07-06 DIAGNOSIS — N400 Enlarged prostate without lower urinary tract symptoms: Secondary | ICD-10-CM | POA: Diagnosis not present

## 2019-07-06 DIAGNOSIS — J9601 Acute respiratory failure with hypoxia: Secondary | ICD-10-CM | POA: Diagnosis not present

## 2019-07-06 DIAGNOSIS — R0789 Other chest pain: Secondary | ICD-10-CM | POA: Diagnosis not present

## 2019-07-06 DIAGNOSIS — I2510 Atherosclerotic heart disease of native coronary artery without angina pectoris: Secondary | ICD-10-CM | POA: Diagnosis not present

## 2019-07-06 DIAGNOSIS — R7881 Bacteremia: Secondary | ICD-10-CM | POA: Diagnosis not present

## 2019-07-06 DIAGNOSIS — J189 Pneumonia, unspecified organism: Secondary | ICD-10-CM | POA: Diagnosis not present

## 2019-07-06 DIAGNOSIS — F329 Major depressive disorder, single episode, unspecified: Secondary | ICD-10-CM | POA: Diagnosis not present

## 2019-07-06 DIAGNOSIS — J151 Pneumonia due to Pseudomonas: Secondary | ICD-10-CM | POA: Diagnosis not present

## 2019-07-06 DIAGNOSIS — A4150 Gram-negative sepsis, unspecified: Secondary | ICD-10-CM | POA: Diagnosis not present

## 2019-07-06 DIAGNOSIS — E7800 Pure hypercholesterolemia, unspecified: Secondary | ICD-10-CM | POA: Diagnosis not present

## 2019-07-06 DIAGNOSIS — Z20828 Contact with and (suspected) exposure to other viral communicable diseases: Secondary | ICD-10-CM | POA: Diagnosis not present

## 2019-07-06 DIAGNOSIS — J168 Pneumonia due to other specified infectious organisms: Secondary | ICD-10-CM | POA: Diagnosis not present

## 2019-07-06 DIAGNOSIS — R509 Fever, unspecified: Secondary | ICD-10-CM | POA: Diagnosis not present

## 2019-07-06 DIAGNOSIS — R918 Other nonspecific abnormal finding of lung field: Secondary | ICD-10-CM | POA: Diagnosis not present

## 2019-07-06 DIAGNOSIS — N200 Calculus of kidney: Secondary | ICD-10-CM | POA: Diagnosis not present

## 2019-07-06 DIAGNOSIS — J8410 Pulmonary fibrosis, unspecified: Secondary | ICD-10-CM | POA: Diagnosis not present

## 2019-07-06 DIAGNOSIS — K5730 Diverticulosis of large intestine without perforation or abscess without bleeding: Secondary | ICD-10-CM | POA: Diagnosis not present

## 2019-07-06 DIAGNOSIS — I129 Hypertensive chronic kidney disease with stage 1 through stage 4 chronic kidney disease, or unspecified chronic kidney disease: Secondary | ICD-10-CM | POA: Diagnosis not present

## 2019-07-06 DIAGNOSIS — J90 Pleural effusion, not elsewhere classified: Secondary | ICD-10-CM | POA: Diagnosis not present

## 2019-07-06 DIAGNOSIS — A419 Sepsis, unspecified organism: Secondary | ICD-10-CM | POA: Diagnosis not present

## 2019-07-06 DIAGNOSIS — A4152 Sepsis due to Pseudomonas: Secondary | ICD-10-CM | POA: Diagnosis not present

## 2019-07-06 DIAGNOSIS — Z8673 Personal history of transient ischemic attack (TIA), and cerebral infarction without residual deficits: Secondary | ICD-10-CM | POA: Diagnosis not present

## 2019-07-06 DIAGNOSIS — R0902 Hypoxemia: Secondary | ICD-10-CM | POA: Diagnosis not present

## 2019-07-06 DIAGNOSIS — I10 Essential (primary) hypertension: Secondary | ICD-10-CM | POA: Diagnosis not present

## 2019-07-06 DIAGNOSIS — Z955 Presence of coronary angioplasty implant and graft: Secondary | ICD-10-CM | POA: Diagnosis not present

## 2019-07-06 DIAGNOSIS — N183 Chronic kidney disease, stage 3 unspecified: Secondary | ICD-10-CM | POA: Diagnosis not present

## 2019-07-06 DIAGNOSIS — I517 Cardiomegaly: Secondary | ICD-10-CM | POA: Diagnosis not present

## 2019-07-06 DIAGNOSIS — K8689 Other specified diseases of pancreas: Secondary | ICD-10-CM | POA: Diagnosis not present

## 2019-07-11 MED ORDER — MYLANTA ULTRA 700-300 MG PO CHEW
3.00 | CHEWABLE_TABLET | ORAL | Status: DC
Start: ? — End: 2019-07-11

## 2019-07-11 MED ORDER — ATORVASTATIN CALCIUM 40 MG PO TABS
40.00 | ORAL_TABLET | ORAL | Status: DC
Start: 2019-07-11 — End: 2019-07-11

## 2019-07-11 MED ORDER — BUTALBITAL-APAP-CAFFEINE 50-325-40 MG PO TABS
1.00 | ORAL_TABLET | ORAL | Status: DC
Start: ? — End: 2019-07-11

## 2019-07-11 MED ORDER — ACETAMINOPHEN 325 MG PO TABS
650.00 | ORAL_TABLET | ORAL | Status: DC
Start: ? — End: 2019-07-11

## 2019-07-11 MED ORDER — GENERIC EXTERNAL MEDICATION
5.00 | Status: DC
Start: ? — End: 2019-07-11

## 2019-07-11 MED ORDER — TOPIRAMATE 25 MG PO TABS
50.00 | ORAL_TABLET | ORAL | Status: DC
Start: 2019-07-10 — End: 2019-07-11

## 2019-07-11 MED ORDER — SODIUM CHLORIDE 0.9 % IV SOLN
10.00 | INTRAVENOUS | Status: DC
Start: ? — End: 2019-07-11

## 2019-07-11 MED ORDER — ENOXAPARIN SODIUM 40 MG/0.4ML ~~LOC~~ SOLN
40.00 | SUBCUTANEOUS | Status: DC
Start: 2019-07-11 — End: 2019-07-11

## 2019-07-11 MED ORDER — GENERIC EXTERNAL MEDICATION
Status: DC
Start: ? — End: 2019-07-11

## 2019-07-11 MED ORDER — LEVOFLOXACIN 750 MG PO TABS
750.00 | ORAL_TABLET | ORAL | Status: DC
Start: 2019-07-11 — End: 2019-07-11

## 2019-07-11 MED ORDER — BENZONATATE 100 MG PO CAPS
100.00 | ORAL_CAPSULE | ORAL | Status: DC
Start: ? — End: 2019-07-11

## 2019-07-11 MED ORDER — ZOLPIDEM TARTRATE 5 MG PO TABS
5.00 | ORAL_TABLET | ORAL | Status: DC
Start: 2019-07-10 — End: 2019-07-11

## 2019-07-11 MED ORDER — LISINOPRIL 20 MG PO TABS
40.00 | ORAL_TABLET | ORAL | Status: DC
Start: 2019-07-11 — End: 2019-07-11

## 2019-07-16 ENCOUNTER — Other Ambulatory Visit: Payer: Self-pay | Admitting: Osteopathic Medicine

## 2019-07-16 DIAGNOSIS — G4700 Insomnia, unspecified: Secondary | ICD-10-CM

## 2019-07-16 NOTE — Telephone Encounter (Signed)
Requested medication (s) are due for refill today: yes  Requested medication (s) are on the active medication list: yes  Last refill: 04/10/2019  Future visit scheduled: yes  Notes to clinic:  Refill cannot be delegated    Requested Prescriptions  Pending Prescriptions Disp Refills   zolpidem (AMBIEN) 10 MG tablet [Pharmacy Med Name: Zolpidem Tartrate 10 MG Oral Tablet] 90 tablet 0    Sig: TAKE 1 TABLET BY MOUTH AT BEDTIME AS NEEDED FOR SLEEP     Not Delegated - Psychiatry:  Anxiolytics/Hypnotics Failed - 07/16/2019  2:30 AM      Failed - This refill cannot be delegated      Failed - Urine Drug Screen completed in last 360 days.      Failed - Valid encounter within last 6 months    Recent Outpatient Visits          6 months ago Insomnia, unspecified type   [place] [facility], [place], DO   9 months ago Cerebrovascular accident (CVA), unspecified mechanism [facility])   [place] [facility], Lanelle Bal, DO   12 months ago Essential hypertension, benign   [place] [facility], Lanelle Bal, DO   1 year ago Essential hypertension, benign   [place] [facility] [place], Lanelle Bal, DO   2 years ago Medicare annual wellness visit, subsequent   [place], Lanelle Bal, DO      Future Appointments            In 6 days [facility]

## 2019-07-17 NOTE — Progress Notes (Signed)
Subjective:   Miguel Glenn is a 78 y.o. male who presents for Medicare Annual/Subsequent preventive examination.  Review of Systems:  No ROS.  Medicare Wellness Virtual Visit.  Visual/audio telehealth visit, UTA vital signs.   See social history for additional risk factors.    Cardiac Risk Factors include: advanced age (>6men, >38 women);hypertension;male gender;sedentary lifestyle  Sleep patterns: Getting on average of 7-9 hours a night. Does not wake up to void. Wakes up and feels rested.  Home Safety/Smoke Alarms: Feels safe in home. Smoke alarms in place.  Living environment; Lives with wife in 2 story home and stairs have hand rails on them. Shower is a walk in shower with grab bars and bench seat in place. Seat Belt Safety/Bike Helmet: Wears seat belt.    Male:   CCS- Aged out    PSA- ordered Lab Results  Component Value Date   PSA 2.03 08/13/2014   PSA 0.93 11/12/2012   PSA 0.74 10/29/2010        Objective:    Vitals: There were no vitals taken for this visit.  There is no height or weight on file to calculate BMI.  Advanced Directives 07/22/2019 07/18/2018  Does Patient Have a Medical Advance Directive? Yes Yes  Type of Estate agent of [place];Living will Healthcare Power of [place];Living will  Does patient want to make changes to medical advance directive? No - Patient declined No - Patient declined  Copy of Healthcare Power of Attorney in Chart? No - copy requested No - copy requested    Tobacco Social History   Tobacco Use  Smoking Status Former Smoker  Smokeless Tobacco Never Used  Tobacco Comment   quit 1996     Counseling given: Not Answered Comment: quit 1996   Clinical Intake:  Pre-visit preparation completed: Yes  Pain : No/denies pain     Nutritional Risks: None Diabetes: No  How often do you need to have someone help you when you read instructions, pamphlets, or other written materials from your doctor  or pharmacy?: 1 - Never What is the last grade level you completed in school?: 12  Interpreter Needed?: No  Information entered by :: Carolin Sicks, LPN  Past Medical History:  Diagnosis Date  . Asthma   . Bladder spasm   . CAD (coronary artery disease)    stents placed in [place] previously  . Cat allergies   . Chronic pancreatitis (HCC)   . Colon polyps    hx  . DDD (degenerative disc disease), lumbar   . Depression with anxiety   . DJD of shoulder   . HLD (hyperlipidemia)   . HTN (hypertension)   . IFG (impaired fasting glucose)   . Pneumonia   . PUD (peptic ulcer disease)    gi bleed   Past Surgical History:  Procedure Laterality Date  . ABDOMINAL EXPLORATION SURGERY  1990s  . APPENDECTOMY    . CHOLECYSTECTOMY    . HIATAL HERNIA REPAIR  1991  . r shoulder replacement  12/10   Dr. Marlyne Beards  . RTC surgery bilat     Family History  Problem Relation Age of Onset  . Lung cancer Mother   . Hypertension Mother   . Hyperlipidemia Mother   . Hypertension Sister    Social History   Socioeconomic History  . Marital status: Married    Spouse name: Miguel Glenn  . Number of children: 4  . Years of education: 1  . Highest education level: GED or  equivalent  Occupational History  . Occupation: truck Public librarian Needs  . Financial resource strain: Not hard at all  . Food insecurity    Worry: Never true    Inability: Never true  . Transportation needs    Medical: No    Non-medical: No  Tobacco Use  . Smoking status: Former Games developer  . Smokeless tobacco: Never Used  . Tobacco comment: quit 1996  Substance and Sexual Activity  . Alcohol use: No  . Drug use: Never  . Sexual activity: Yes  Lifestyle  . Physical activity    Days per week: 0 days    Minutes per session: 0 min  . Stress: Not at all  Relationships  . Social connections    Talks on phone: More than three times a week    Gets together: More than three times a week    Attends religious service: More than 4  times per year    Active member of club or organization: No    Attends meetings of clubs or organizations: Never    Relationship status: Married  Other Topics Concern  . 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    Outpatient Encounter Medications as of 07/22/2019  Medication Sig  . alfuzosin (UROXATRAL) 10 MG 24 hr tablet Take 1 tablet (10 mg total) by mouth daily with breakfast.  . aspirin 325 MG tablet Take 1 tablet (325 mg total) by mouth daily.  Marland Kitchen atorvastatin (LIPITOR) 40 MG tablet Take 1 tablet (40 mg total) by mouth daily.  Marland Kitchen lisinopril (PRINIVIL,ZESTRIL) 40 MG tablet Take 1 tablet (40 mg total) by mouth daily.  . MULTIPLE VITAMIN PO Take by mouth.    . topiramate (TOPAMAX) 50 MG tablet Take 1 tablet (50 mg total) by mouth daily.  . traZODone (DESYREL) 50 MG tablet TAKE 1 TO 2 TABLETS BY MOUTH AT BEDTIME AS NEEDED FOR SLEEP  . zolpidem (AMBIEN) 10 MG tablet TAKE 1 TABLET BY MOUTH AT BEDTIME AS NEEDED FOR SLEEP  . sildenafil (REVATIO) 20 MG tablet Take 1-4 tablets (20-80 mg total) by mouth as needed (prior to sex). (Patient not taking: Reported on 07/22/2019)   No facility-administered encounter medications on file as of 07/22/2019.     Activities of Daily Living In your present state of health, do you have any difficulty performing the following activities: 07/22/2019  Hearing? Y  Comment wears hearing aids bilaterally  Vision? N  Difficulty concentrating or making decisions? N  Walking or climbing stairs? N  Dressing or bathing? N  Doing errands, shopping? N  Preparing Food and eating ? N  Using the Toilet? N  In the past six months, have you accidently leaked urine? N  Do you have problems with loss of bowel control? N  Managing your Medications? N  Managing your Finances? N  Housekeeping or managing your Housekeeping? N  Some recent data might be hidden    Patient Care Team: Sunnie Nielsen,  DO as PCP - General (Osteopathic Medicine)   Assessment:   This is a routine wellness examination for Miguel Glenn.Physical assessment deferred to PCP.   Exercise Activities and Dietary recommendations Current Exercise Habits: The patient does not participate in regular exercise at present, Exercise limited by: cardiac condition(s) Diet Eats a healthy diet. Eats a lot of chef salads. Breakfast: skips Lunch: skips or crackers with peanut butter Dinner: Meat and vegetables. Drinks tea a lot.  Doesn't drink a lot of water.      Goals    . Weight (lb) < 200 lb (90.7 kg)     Would like to loose 20lbs and be more active.       Fall Risk Fall Risk  07/22/2019 07/18/2018 11/12/2013  Falls in the past year? 0 0 No  Number falls in past yr: 0 - -  Injury with Fall? 0 - -  Follow up Falls prevention discussed - -   Is the patient's home free of loose throw rugs in walkways, pet beds, electrical cords, etc?   yes      Grab bars in the bathroom? yes      Handrails on the stairs?   yes      Adequate lighting?   yes   Depression Screen PHQ 2/9 Scores 01/16/2019 07/18/2018 07/18/2018 07/18/2016  PHQ - 2 Score 0 0 0 0  PHQ- 9 Score - - - 2    Cognitive Function     6CIT Screen 07/22/2019 07/18/2018 07/10/2017  What Year? 0 points 0 points 0 points  What month? 0 points 0 points 0 points  What time? 0 points 0 points 0 points  Count back from 20 0 points 0 points 0 points  Months in reverse 2 points 2 points 0 points  Repeat phrase 0 points 0 points 4 points  Total Score 2 2 4     Immunization History  Administered Date(s) Administered  . Influenza Split 05/31/2011  . Influenza, High Dose Seasonal PF 07/10/2017, 06/28/2018  . Influenza,inj,Quad PF,6+ Mos 11/12/2013, 06/17/2016  . Influenza-Unspecified 07/17/2015  . Pneumococcal Conjugate-13 07/17/2015  . Pneumococcal Polysaccharide-23 03/05/2009  . Td 09/05/2004  . Tdap 06/17/2016    Screening Tests Health Maintenance  Topic Date  Due  . INFLUENZA VACCINE  04/06/2019  . TETANUS/TDAP  06/17/2026  . PNA vac Low Risk Adult  Completed        Plan:    [name] , Thank you for taking time to come for your Medicare Wellness Visit. I appreciate your ongoing commitment to your health goals. Please review the following plan we discussed and let me know if I can assist you in the future. Please schedule your next medicare wellness visit with me in 1 yr. Bring a copy of your living will and/or healthcare power of attorney to your next office visit. Continue doing brain stimulating activities (puzzles, reading, adult coloring books, staying active) to keep memory sharp.     These are the goals we discussed: Goals    . Weight (lb) < 200 lb (90.7 kg)     Would like to loose 20lbs and be more active.       This is a list of the screening recommended for you and due dates:  Health Maintenance  Topic Date Due  . Flu Shot  04/06/2019  . Tetanus Vaccine  06/17/2026  . Pneumonia vaccines  Completed     I have personally reviewed and noted the following in the patient's chart:   . Medical and social history . Use of alcohol, tobacco or illicit drugs  . Current medications and supplements . Functional ability and status . Nutritional status . Physical activity . Advanced directives . List of other physicians . Hospitalizations, surgeries, and ER visits in previous 12 months . Vitals . Screenings to include cognitive, depression, and falls . Referrals and appointments  In addition, I have reviewed and discussed with patient certain preventive protocols, quality metrics, and  best practice recommendations. A written personalized care plan for preventive services as well as general preventive health recommendations were provided to patient.     Joanne Chars, LPN  [id number]

## 2019-07-22 ENCOUNTER — Other Ambulatory Visit: Payer: Self-pay | Admitting: Osteopathic Medicine

## 2019-07-22 ENCOUNTER — Ambulatory Visit (INDEPENDENT_AMBULATORY_CARE_PROVIDER_SITE_OTHER): Payer: Medicare PPO | Admitting: *Deleted

## 2019-07-22 ENCOUNTER — Ambulatory Visit: Payer: Medicare PPO

## 2019-07-22 ENCOUNTER — Other Ambulatory Visit: Payer: Self-pay

## 2019-07-22 VITALS — BP 122/61 | HR 73 | Temp 97.9°F | Ht 77.0 in | Wt 238.0 lb

## 2019-07-22 DIAGNOSIS — R519 Headache, unspecified: Secondary | ICD-10-CM

## 2019-07-22 DIAGNOSIS — Z125 Encounter for screening for malignant neoplasm of prostate: Secondary | ICD-10-CM

## 2019-07-22 DIAGNOSIS — N3281 Overactive bladder: Secondary | ICD-10-CM

## 2019-07-22 DIAGNOSIS — N529 Male erectile dysfunction, unspecified: Secondary | ICD-10-CM

## 2019-07-22 DIAGNOSIS — I10 Essential (primary) hypertension: Secondary | ICD-10-CM

## 2019-07-22 DIAGNOSIS — N400 Enlarged prostate without lower urinary tract symptoms: Secondary | ICD-10-CM

## 2019-07-22 DIAGNOSIS — G4700 Insomnia, unspecified: Secondary | ICD-10-CM

## 2019-07-22 MED ORDER — ALFUZOSIN HCL ER 10 MG PO TB24: 10.0000 mg | ORAL_TABLET | Freq: Every day | ORAL | 3 refills | Status: DC

## 2019-07-22 MED ORDER — LISINOPRIL 40 MG PO TABS: 40.0000 mg | ORAL_TABLET | Freq: Every day | ORAL | 3 refills | Status: DC

## 2019-07-22 MED ORDER — TRAZODONE HCL 50 MG PO TABS: 50.0000 mg | ORAL_TABLET | Freq: Every evening | ORAL | 1 refills | Status: DC | PRN

## 2019-07-22 MED ORDER — TOPIRAMATE 50 MG PO TABS: 50.0000 mg | ORAL_TABLET | Freq: Every day | ORAL | 3 refills | Status: DC

## 2019-07-22 MED ORDER — ATORVASTATIN CALCIUM 40 MG PO TABS: 40.0000 mg | ORAL_TABLET | Freq: Every day | ORAL | 3 refills | Status: DC

## 2019-07-22 MED ORDER — ZOLPIDEM TARTRATE 10 MG PO TABS: 10.0000 mg | ORAL_TABLET | Freq: Every evening | ORAL | 0 refills | Status: DC | PRN

## 2019-07-22 NOTE — Patient Instructions (Signed)
[name] , Thank you for taking time to come for your Medicare Wellness Visit. I appreciate your ongoing commitment to your health goals. Please review the following plan we discussed and let me know if I can assist you in the future. Please schedule your next medicare wellness visit with me in 1 yr. Bring a copy of your living will and/or healthcare power of attorney to your next office visit. Continue doing brain stimulating activities (puzzles, reading, adult coloring books, staying active) to keep memory sha These are the goals we discussed: Goals    . Weight (lb) < 200 lb (90.7 kg)     Would like to loose 20lbs and be more active.

## 2019-07-23 LAB — PSA: PSA: 2.2 ng/mL (ref <=4.0)

## 2019-07-26 DIAGNOSIS — J189 Pneumonia, unspecified organism: Secondary | ICD-10-CM | POA: Diagnosis not present

## 2019-07-26 DIAGNOSIS — J984 Other disorders of lung: Secondary | ICD-10-CM | POA: Diagnosis not present

## 2019-07-26 DIAGNOSIS — Z87891 Personal history of nicotine dependence: Secondary | ICD-10-CM | POA: Diagnosis not present

## 2019-07-26 DIAGNOSIS — J151 Pneumonia due to Pseudomonas: Secondary | ICD-10-CM | POA: Diagnosis not present

## 2019-07-26 DIAGNOSIS — R0602 Shortness of breath: Secondary | ICD-10-CM | POA: Diagnosis not present

## 2019-07-26 DIAGNOSIS — I10 Essential (primary) hypertension: Secondary | ICD-10-CM | POA: Diagnosis not present

## 2019-07-26 DIAGNOSIS — Z09 Encounter for follow-up examination after completed treatment for conditions other than malignant neoplasm: Secondary | ICD-10-CM | POA: Diagnosis not present

## 2019-08-09 DIAGNOSIS — J151 Pneumonia due to Pseudomonas: Secondary | ICD-10-CM | POA: Diagnosis not present

## 2019-09-09 DIAGNOSIS — J151 Pneumonia due to Pseudomonas: Secondary | ICD-10-CM | POA: Diagnosis not present

## 2019-10-02 DIAGNOSIS — R1013 Epigastric pain: Secondary | ICD-10-CM | POA: Diagnosis not present

## 2019-10-02 DIAGNOSIS — Z8719 Personal history of other diseases of the digestive system: Secondary | ICD-10-CM | POA: Diagnosis not present

## 2019-10-02 DIAGNOSIS — Z9889 Other specified postprocedural states: Secondary | ICD-10-CM | POA: Diagnosis not present

## 2019-10-02 DIAGNOSIS — Z9049 Acquired absence of other specified parts of digestive tract: Secondary | ICD-10-CM | POA: Diagnosis not present

## 2019-10-03 DIAGNOSIS — K5730 Diverticulosis of large intestine without perforation or abscess without bleeding: Secondary | ICD-10-CM | POA: Diagnosis not present

## 2019-10-03 DIAGNOSIS — N200 Calculus of kidney: Secondary | ICD-10-CM | POA: Diagnosis not present

## 2019-10-03 DIAGNOSIS — R1013 Epigastric pain: Secondary | ICD-10-CM | POA: Diagnosis not present

## 2019-10-03 DIAGNOSIS — R1010 Upper abdominal pain, unspecified: Secondary | ICD-10-CM | POA: Diagnosis not present

## 2019-10-10 DIAGNOSIS — Z9049 Acquired absence of other specified parts of digestive tract: Secondary | ICD-10-CM | POA: Diagnosis not present

## 2019-10-10 DIAGNOSIS — R1013 Epigastric pain: Secondary | ICD-10-CM | POA: Diagnosis not present

## 2019-10-10 DIAGNOSIS — Z9889 Other specified postprocedural states: Secondary | ICD-10-CM | POA: Diagnosis not present

## 2019-10-10 DIAGNOSIS — J151 Pneumonia due to Pseudomonas: Secondary | ICD-10-CM | POA: Diagnosis not present

## 2019-10-10 DIAGNOSIS — Z8719 Personal history of other diseases of the digestive system: Secondary | ICD-10-CM | POA: Diagnosis not present

## 2019-10-11 ENCOUNTER — Encounter: Payer: Self-pay | Admitting: Osteopathic Medicine

## 2019-10-11 DIAGNOSIS — K2980 Duodenitis without bleeding: Secondary | ICD-10-CM | POA: Diagnosis not present

## 2019-10-11 DIAGNOSIS — K3189 Other diseases of stomach and duodenum: Secondary | ICD-10-CM | POA: Diagnosis not present

## 2019-10-11 DIAGNOSIS — R1013 Epigastric pain: Secondary | ICD-10-CM | POA: Diagnosis not present

## 2019-11-07 DIAGNOSIS — J151 Pneumonia due to Pseudomonas: Secondary | ICD-10-CM | POA: Diagnosis not present

## 2019-12-06 DIAGNOSIS — B029 Zoster without complications: Secondary | ICD-10-CM | POA: Diagnosis not present

## 2019-12-06 DIAGNOSIS — L089 Local infection of the skin and subcutaneous tissue, unspecified: Secondary | ICD-10-CM | POA: Diagnosis not present

## 2019-12-06 DIAGNOSIS — R519 Headache, unspecified: Secondary | ICD-10-CM | POA: Diagnosis not present

## 2019-12-08 DIAGNOSIS — J151 Pneumonia due to Pseudomonas: Secondary | ICD-10-CM | POA: Diagnosis not present

## 2020-01-07 DIAGNOSIS — J151 Pneumonia due to Pseudomonas: Secondary | ICD-10-CM | POA: Diagnosis not present

## 2020-01-12 ENCOUNTER — Other Ambulatory Visit: Payer: Self-pay | Admitting: Osteopathic Medicine

## 2020-01-12 DIAGNOSIS — G4700 Insomnia, unspecified: Secondary | ICD-10-CM

## 2020-01-13 NOTE — Telephone Encounter (Signed)
[facility] pharmacy requesting med refill for zolpidem. 

## 2020-01-20 ENCOUNTER — Encounter: Payer: Self-pay | Admitting: Osteopathic Medicine

## 2020-01-20 ENCOUNTER — Telehealth (INDEPENDENT_AMBULATORY_CARE_PROVIDER_SITE_OTHER): Payer: Medicare PPO | Admitting: Osteopathic Medicine

## 2020-01-20 VITALS — BP 128/72 | HR 60 | Temp 97.4°F | Wt 238.0 lb

## 2020-01-20 DIAGNOSIS — F5101 Primary insomnia: Secondary | ICD-10-CM | POA: Diagnosis not present

## 2020-01-20 DIAGNOSIS — E785 Hyperlipidemia, unspecified: Secondary | ICD-10-CM

## 2020-01-20 DIAGNOSIS — G4700 Insomnia, unspecified: Secondary | ICD-10-CM

## 2020-01-20 DIAGNOSIS — I10 Essential (primary) hypertension: Secondary | ICD-10-CM | POA: Diagnosis not present

## 2020-01-20 MED ORDER — ZOLPIDEM TARTRATE 10 MG PO TABS: 10.0000 mg | ORAL_TABLET | Freq: Every evening | ORAL | 0 refills | Status: DC | PRN

## 2020-01-20 MED ORDER — TRAZODONE HCL 50 MG PO TABS: 50.0000 mg | ORAL_TABLET | Freq: Every evening | ORAL | 1 refills | Status: DC | PRN

## 2020-01-20 NOTE — Progress Notes (Signed)
Virtual Visit via Phone  I connected with      Miguel Glenn on 01/20/20 at 4:47 PM  by a telemedicine application and verified that I am speaking with the correct person using two identifiers.  Patient is at work I am in office   I discussed the limitations of evaluation and management by telemedicine and the availability of in person appointments. The patient expressed understanding and agreed to proceed.  History of Present Illness: Miguel Glenn is a 79 y.o. male who would like to discuss med refills  Labs reviewed 10/02/19 CBC, CMP; 07/06/19 Lipids - see Care Everywhere, no concerns    Feeling great, no concerns Ambien is helping the chronic insomnia     Observations/Objective: BP 128/72 (BP Location: Left Arm, Patient Position: Sitting, Cuff Size: Normal)   Pulse 60   Temp (!) 97.4 F (36.3 C) (Oral)   Wt 238 lb (108 kg)   BMI 28.22 kg/m  BP Readings from Last 3 Encounters:  01/20/20 128/72  07/22/19 122/61  01/16/19 133/80   Exam: Normal Speech.  NAD  Lab and Radiology Results No results found for this or any previous visit (from the past 72 hour(s)). No results found.     Assessment and Plan: 79 y.o. male with The primary encounter diagnosis was Primary insomnia. Diagnoses of Essential hypertension, benign, Hyperlipidemia, unspecified hyperlipidemia type, and Insomnia, unspecified type were also pertinent to this visit.   PDMP not reviewed this encounter. No orders of the defined types were placed in this encounter.  Meds ordered this encounter  Medications  . traZODone (DESYREL) 50 MG tablet    Sig: Take 1-2 tablets (50-100 mg total) by mouth at bedtime as needed. for sleep    Dispense:  90 tablet    Refill:  1  . zolpidem (AMBIEN) 10 MG tablet    Sig: Take 1 tablet (10 mg total) by mouth at bedtime as needed. for sleep    Dispense:  90 tablet    Refill:  0    Follow Up Instructions: Return in about 6 months (around 07/22/2020) for  ANNUAL (call week prior to visit for lab orders).    I discussed the assessment and treatment plan with the patient. The patient was provided an opportunity to ask questions and all were answered. The patient agreed with the plan and demonstrated an understanding of the instructions.   The patient was advised to call back or seek an in-person evaluation if any new concerns, if symptoms worsen or if the condition fails to improve as anticipated.  20 minutes of non-face-to-face time was provided during this encounter.      . . . . . . . . . . . . . Marland Kitchen                   Historical information moved to improve visibility of documentation.  Past Medical History:  Diagnosis Date  . Asthma   . Bladder spasm   . CAD (coronary artery disease)    stents placed in [place] previously  . Cat allergies   . Chronic pancreatitis (HCC)   . Colon polyps    hx  . DDD (degenerative disc disease), lumbar   . Depression with anxiety   . DJD of shoulder   . HLD (hyperlipidemia)   . HTN (hypertension)   . IFG (impaired fasting glucose)   . Pneumonia   . PUD (peptic ulcer disease)    gi bleed  Past Surgical History:  Procedure Laterality Date  . ABDOMINAL EXPLORATION SURGERY  1990s  . APPENDECTOMY    . CHOLECYSTECTOMY    . HIATAL HERNIA REPAIR  1991  . r shoulder replacement  12/10   Dr. Creig Hines  . RTC surgery bilat     Social History   Tobacco Use  . Smoking status: Former Research scientist (life sciences)  . Smokeless tobacco: Never Used  . Tobacco comment: quit 1996  Substance Use Topics  . Alcohol use: No   family history includes Hyperlipidemia in his mother; Hypertension in his mother and sister; Lung cancer in his mother.  Medications: Current Outpatient Medications  Medication Sig Dispense Refill  . alfuzosin (UROXATRAL) 10 MG 24 hr tablet Take 1 tablet (10 mg total) by mouth daily with breakfast. 90 tablet 3  . aspirin 325 MG tablet Take 1 tablet (325 mg total) by mouth  daily. 90 tablet 3  . atorvastatin (LIPITOR) 40 MG tablet Take 1 tablet (40 mg total) by mouth daily. 90 tablet 3  . lisinopril (ZESTRIL) 40 MG tablet Take 1 tablet (40 mg total) by mouth daily. 90 tablet 3  . MULTIPLE VITAMIN PO Take by mouth.      . topiramate (TOPAMAX) 50 MG tablet Take 1 tablet (50 mg total) by mouth daily. 90 tablet 3  . traZODone (DESYREL) 50 MG tablet Take 1-2 tablets (50-100 mg total) by mouth at bedtime as needed. for sleep 90 tablet 1  . [START ON 04/13/2020] zolpidem (AMBIEN) 10 MG tablet Take 1 tablet (10 mg total) by mouth at bedtime as needed. for sleep 90 tablet 0  . sildenafil (REVATIO) 20 MG tablet Take 1-4 tablets (20-80 mg total) by mouth as needed (prior to sex). (Patient not taking: Reported on 01/20/2020) 50 tablet 11   No current facility-administered medications for this visit.   No Known Allergies  Immunization History  Administered Date(s) Administered  . 19-influenza Whole 05/21/2019  . Influenza Split 05/31/2011, 07/17/2015, 06/03/2019  . Influenza, High Dose Seasonal PF 07/10/2017, 06/28/2018, 05/21/2019  . Influenza,inj,Quad PF,6+ Mos 11/12/2013, 06/17/2016  . Influenza-Unspecified 07/17/2015, 07/10/2017, 06/28/2018, 06/03/2019  . PFIZER SARS-COV-2 Vaccination 12/09/2019, 01/02/2020  . Pneumococcal Conjugate-13 07/17/2015  . Pneumococcal Polysaccharide-23 03/05/2009  . Pneumococcal-Unspecified 03/05/2009  . Td 09/05/2004  . Tdap 06/17/2016

## 2020-02-06 DIAGNOSIS — D225 Melanocytic nevi of trunk: Secondary | ICD-10-CM | POA: Diagnosis not present

## 2020-02-06 DIAGNOSIS — L918 Other hypertrophic disorders of the skin: Secondary | ICD-10-CM | POA: Diagnosis not present

## 2020-02-06 DIAGNOSIS — I781 Nevus, non-neoplastic: Secondary | ICD-10-CM | POA: Diagnosis not present

## 2020-02-06 DIAGNOSIS — D3617 Benign neoplasm of peripheral nerves and autonomic nervous system of trunk, unspecified: Secondary | ICD-10-CM | POA: Diagnosis not present

## 2020-02-06 DIAGNOSIS — D485 Neoplasm of uncertain behavior of skin: Secondary | ICD-10-CM | POA: Diagnosis not present

## 2020-02-07 DIAGNOSIS — J151 Pneumonia due to Pseudomonas: Secondary | ICD-10-CM | POA: Diagnosis not present

## 2020-03-08 DIAGNOSIS — J151 Pneumonia due to Pseudomonas: Secondary | ICD-10-CM | POA: Diagnosis not present

## 2020-03-09 DIAGNOSIS — T675XXA Heat exhaustion, unspecified, initial encounter: Secondary | ICD-10-CM | POA: Diagnosis not present

## 2020-03-09 DIAGNOSIS — Z8673 Personal history of transient ischemic attack (TIA), and cerebral infarction without residual deficits: Secondary | ICD-10-CM | POA: Diagnosis not present

## 2020-03-09 DIAGNOSIS — R0902 Hypoxemia: Secondary | ICD-10-CM | POA: Diagnosis not present

## 2020-03-09 DIAGNOSIS — R001 Bradycardia, unspecified: Secondary | ICD-10-CM | POA: Diagnosis not present

## 2020-03-09 DIAGNOSIS — Z8719 Personal history of other diseases of the digestive system: Secondary | ICD-10-CM | POA: Diagnosis not present

## 2020-03-09 DIAGNOSIS — R079 Chest pain, unspecified: Secondary | ICD-10-CM | POA: Diagnosis not present

## 2020-03-09 DIAGNOSIS — R7989 Other specified abnormal findings of blood chemistry: Secondary | ICD-10-CM | POA: Diagnosis not present

## 2020-03-09 DIAGNOSIS — R609 Edema, unspecified: Secondary | ICD-10-CM | POA: Diagnosis not present

## 2020-03-09 DIAGNOSIS — I2510 Atherosclerotic heart disease of native coronary artery without angina pectoris: Secondary | ICD-10-CM | POA: Diagnosis not present

## 2020-03-09 DIAGNOSIS — E785 Hyperlipidemia, unspecified: Secondary | ICD-10-CM | POA: Diagnosis not present

## 2020-03-09 DIAGNOSIS — Z955 Presence of coronary angioplasty implant and graft: Secondary | ICD-10-CM | POA: Diagnosis not present

## 2020-03-09 DIAGNOSIS — I129 Hypertensive chronic kidney disease with stage 1 through stage 4 chronic kidney disease, or unspecified chronic kidney disease: Secondary | ICD-10-CM | POA: Diagnosis not present

## 2020-03-09 DIAGNOSIS — F329 Major depressive disorder, single episode, unspecified: Secondary | ICD-10-CM | POA: Diagnosis not present

## 2020-03-09 DIAGNOSIS — R110 Nausea: Secondary | ICD-10-CM | POA: Diagnosis not present

## 2020-03-09 DIAGNOSIS — J984 Other disorders of lung: Secondary | ICD-10-CM | POA: Diagnosis not present

## 2020-03-09 DIAGNOSIS — R0789 Other chest pain: Secondary | ICD-10-CM | POA: Diagnosis not present

## 2020-03-09 DIAGNOSIS — R1111 Vomiting without nausea: Secondary | ICD-10-CM | POA: Diagnosis not present

## 2020-03-09 DIAGNOSIS — Z8711 Personal history of peptic ulcer disease: Secondary | ICD-10-CM | POA: Diagnosis not present

## 2020-03-09 DIAGNOSIS — I10 Essential (primary) hypertension: Secondary | ICD-10-CM | POA: Diagnosis not present

## 2020-03-09 DIAGNOSIS — R778 Other specified abnormalities of plasma proteins: Secondary | ICD-10-CM | POA: Diagnosis not present

## 2020-03-09 DIAGNOSIS — N183 Chronic kidney disease, stage 3 unspecified: Secondary | ICD-10-CM | POA: Diagnosis not present

## 2020-03-10 DIAGNOSIS — I10 Essential (primary) hypertension: Secondary | ICD-10-CM | POA: Diagnosis not present

## 2020-03-10 DIAGNOSIS — E785 Hyperlipidemia, unspecified: Secondary | ICD-10-CM | POA: Diagnosis not present

## 2020-03-10 DIAGNOSIS — I340 Nonrheumatic mitral (valve) insufficiency: Secondary | ICD-10-CM | POA: Diagnosis not present

## 2020-03-10 DIAGNOSIS — R079 Chest pain, unspecified: Secondary | ICD-10-CM | POA: Diagnosis not present

## 2020-03-10 DIAGNOSIS — I517 Cardiomegaly: Secondary | ICD-10-CM | POA: Diagnosis not present

## 2020-03-11 DIAGNOSIS — I2510 Atherosclerotic heart disease of native coronary artery without angina pectoris: Secondary | ICD-10-CM | POA: Diagnosis not present

## 2020-03-11 DIAGNOSIS — R079 Chest pain, unspecified: Secondary | ICD-10-CM | POA: Diagnosis not present

## 2020-03-12 DIAGNOSIS — R079 Chest pain, unspecified: Secondary | ICD-10-CM | POA: Diagnosis not present

## 2020-03-23 DIAGNOSIS — Z1211 Encounter for screening for malignant neoplasm of colon: Secondary | ICD-10-CM | POA: Diagnosis not present

## 2020-03-23 DIAGNOSIS — K5730 Diverticulosis of large intestine without perforation or abscess without bleeding: Secondary | ICD-10-CM | POA: Diagnosis not present

## 2020-03-23 DIAGNOSIS — K648 Other hemorrhoids: Secondary | ICD-10-CM | POA: Diagnosis not present

## 2020-03-23 LAB — HM COLONOSCOPY

## 2020-03-27 ENCOUNTER — Encounter: Payer: Self-pay | Admitting: Osteopathic Medicine

## 2020-04-08 DIAGNOSIS — J151 Pneumonia due to Pseudomonas: Secondary | ICD-10-CM | POA: Diagnosis not present

## 2020-04-16 DIAGNOSIS — I10 Essential (primary) hypertension: Secondary | ICD-10-CM | POA: Diagnosis not present

## 2020-04-16 DIAGNOSIS — E785 Hyperlipidemia, unspecified: Secondary | ICD-10-CM | POA: Diagnosis not present

## 2020-04-16 DIAGNOSIS — I2510 Atherosclerotic heart disease of native coronary artery without angina pectoris: Secondary | ICD-10-CM | POA: Diagnosis not present

## 2020-04-16 DIAGNOSIS — T675XXD Heat exhaustion, unspecified, subsequent encounter: Secondary | ICD-10-CM | POA: Diagnosis not present

## 2020-04-16 DIAGNOSIS — Z955 Presence of coronary angioplasty implant and graft: Secondary | ICD-10-CM | POA: Diagnosis not present

## 2020-05-09 DIAGNOSIS — J151 Pneumonia due to Pseudomonas: Secondary | ICD-10-CM | POA: Diagnosis not present

## 2020-05-20 DIAGNOSIS — G459 Transient cerebral ischemic attack, unspecified: Secondary | ICD-10-CM | POA: Diagnosis not present

## 2020-06-08 DIAGNOSIS — J151 Pneumonia due to Pseudomonas: Secondary | ICD-10-CM | POA: Diagnosis not present

## 2020-07-09 DIAGNOSIS — J151 Pneumonia due to Pseudomonas: Secondary | ICD-10-CM | POA: Diagnosis not present

## 2020-07-13 ENCOUNTER — Other Ambulatory Visit: Payer: Self-pay | Admitting: Osteopathic Medicine

## 2020-07-13 DIAGNOSIS — G4700 Insomnia, unspecified: Secondary | ICD-10-CM

## 2020-07-22 ENCOUNTER — Ambulatory Visit: Payer: Medicare PPO

## 2020-07-27 ENCOUNTER — Other Ambulatory Visit: Payer: Self-pay

## 2020-07-27 ENCOUNTER — Ambulatory Visit (INDEPENDENT_AMBULATORY_CARE_PROVIDER_SITE_OTHER): Payer: Medicare PPO | Admitting: Nurse Practitioner

## 2020-07-27 ENCOUNTER — Encounter: Payer: Self-pay | Admitting: Nurse Practitioner

## 2020-07-27 VITALS — BP 127/81 | HR 61 | Temp 98.1°F | Ht 75.5 in | Wt 244.1 lb

## 2020-07-27 DIAGNOSIS — I639 Cerebral infarction, unspecified: Secondary | ICD-10-CM | POA: Diagnosis not present

## 2020-07-27 DIAGNOSIS — G4709 Other insomnia: Secondary | ICD-10-CM

## 2020-07-27 DIAGNOSIS — N3281 Overactive bladder: Secondary | ICD-10-CM

## 2020-07-27 DIAGNOSIS — E785 Hyperlipidemia, unspecified: Secondary | ICD-10-CM

## 2020-07-27 DIAGNOSIS — I2510 Atherosclerotic heart disease of native coronary artery without angina pectoris: Secondary | ICD-10-CM | POA: Diagnosis not present

## 2020-07-27 DIAGNOSIS — Z0000 Encounter for general adult medical examination without abnormal findings: Secondary | ICD-10-CM | POA: Diagnosis not present

## 2020-07-27 DIAGNOSIS — N400 Enlarged prostate without lower urinary tract symptoms: Secondary | ICD-10-CM

## 2020-07-27 DIAGNOSIS — I10 Essential (primary) hypertension: Secondary | ICD-10-CM

## 2020-07-27 DIAGNOSIS — R519 Headache, unspecified: Secondary | ICD-10-CM | POA: Diagnosis not present

## 2020-07-27 MED ORDER — ALFUZOSIN HCL ER 10 MG PO TB24: 10.0000 mg | ORAL_TABLET | Freq: Every day | ORAL | 3 refills | Status: DC

## 2020-07-27 MED ORDER — ATORVASTATIN CALCIUM 40 MG PO TABS: 40.0000 mg | ORAL_TABLET | Freq: Every day | ORAL | 3 refills | Status: DC

## 2020-07-27 MED ORDER — LISINOPRIL 40 MG PO TABS: 40.0000 mg | ORAL_TABLET | Freq: Every day | ORAL | 3 refills | Status: DC

## 2020-07-27 MED ORDER — TRAZODONE HCL 50 MG PO TABS: 50.0000 mg | ORAL_TABLET | Freq: Every evening | ORAL | 1 refills | Status: DC | PRN

## 2020-07-27 MED ORDER — TOPIRAMATE 50 MG PO TABS: 50.0000 mg | ORAL_TABLET | Freq: Every day | ORAL | 3 refills | Status: DC

## 2020-07-27 MED ORDER — ASPIRIN 325 MG PO TABS: 325.0000 mg | ORAL_TABLET | Freq: Every day | ORAL | 3 refills | Status: AC

## 2020-07-27 NOTE — Progress Notes (Signed)
Subjective:   Miguel Glenn is a 79 y.o. male who presents for Medicare Annual/Subsequent preventive examination. METHOD OF VISIT: In person Encounter participants:patient, myself Patient: Miguel Glenn  Nurse/Provider: Sung Amabile Mikkel Charrette   Review of Systems:  Neuro: Denies difficulty remembering daily tasks, people, or places.  Ear: Denies difficulty hearing or need to increase volume on television or telephone to hear Eye: Denies visual changes, difficulty reading normal print, or visual field deficits. Cardiac: Denies chest pain, palpitations, dizziness, shortness of breath, pain in lower extremities, or night time waking with shortness of breath. Lung: Denies shortness of breath, difficulty breathing, chronic cough, or dizziness.  GI: Denies changes in bowel habits, blood in stool, difficulty passing stool, decreased intake of food or drink, nausea, or vomiting.  GU: Denies changes in urinary habits, dark urine, malodorous urine, increased or decreased urination, or urinary incontinence.  MSK: Denies weakness in extremities, difficulty walking, difficulty grasping, or new MSK pain.  Skin: Denies changes to the skin, fragile skin, or increased bruising.  Constitution: Denies fatigue, weakness, or confusion.        Objective:    Vitals: BP 127/81   Pulse 61   Temp 98.1 F (36.7 C) (Oral)   Ht 6' 3.5" (1.918 m)   Wt 244 lb 1.6 oz (110.7 kg)   SpO2 96%   BMI 30.11 kg/m   Body mass index is 30.11 kg/m.  Advanced Directives 07/27/2020 07/22/2019 07/18/2018  Does Patient Have a Medical Advance Directive? Yes Yes Yes  Type of Estate agent of [place];Living will Healthcare Power of [place];Living will Healthcare Power of [place];Living will  Does patient want to make changes to medical advance directive? No - Patient declined No - Patient declined No - Patient declined  Copy of Healthcare Power of Attorney in Chart? No - copy requested No - copy requested  No - copy requested    Tobacco Social History   Tobacco Use  Smoking Status Former Smoker  Smokeless Tobacco Never Used  Tobacco Comment   quit 1996     Counseling given: Not Answered Comment: quit 1996 No longer qualifies for CT  Clinical Intake:  Pre-visit preparation completed: Yes  Pain : No/denies pain     BMI - recorded: 30.11 Nutritional Status: BMI > 30  Obese Diabetes: No  How often do you need to have someone help you when you read instructions, pamphlets, or other written materials from your doctor or pharmacy?: 1 - Never What is the last grade level you completed in school?: high school diploma  Interpreter Needed?: No  Information entered by :: [place],CMA  Past Medical History:  Diagnosis Date  . Asthma   . Bladder spasm   . CAD (coronary artery disease)    stents placed in [place] previously  . Cat allergies   . Chronic pancreatitis (HCC)   . Colon polyps    hx  . DDD (degenerative disc disease), lumbar   . Depression with anxiety   . DJD of shoulder   . HLD (hyperlipidemia)   . HTN (hypertension)   . IFG (impaired fasting glucose)   . Pneumonia   . PUD (peptic ulcer disease)    gi bleed   Past Surgical History:  Procedure Laterality Date  . ABDOMINAL EXPLORATION SURGERY  1990s  . APPENDECTOMY    . CHOLECYSTECTOMY    . HIATAL HERNIA REPAIR  1991  . r shoulder replacement  12/10   Dr. Marlyne Beards  . RTC surgery bilat  Family History  Problem Relation Age of Onset  . Lung cancer Mother   . Hypertension Mother   . Hyperlipidemia Mother   . Hypertension Sister    Social History   Socioeconomic History  . Marital status: Married    Spouse name: Lea  . Number of children: 4  . Years of education: 23  . Highest education level: GED or equivalent  Occupational History  . Occupation: truck Hospital doctor  Tobacco Use  . Smoking status: Former Games developer  . Smokeless tobacco: Never Used  . Tobacco comment: quit 1996  Vaping Use  . Vaping Use:  Never used  Substance and Sexual Activity  . Alcohol use: No  . Drug use: Never  . Sexual activity: Yes    Partners: Female  Other Topics Concern  . 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   Social Determinants of Health   Financial Resource Strain:   . Difficulty of Paying Living Expenses: Not on file  Food Insecurity:   . Worried About Programme researcher, broadcasting/film/video in the Last Year: Not on file  . Ran Out of Food in the Last Year: Not on file  Transportation Needs: No Transportation Needs  . Lack of Transportation (Medical): No  . Lack of Transportation (Non-Medical): No  Physical Activity: Insufficiently Active  . Days of Exercise per Week: 5 days  . Minutes of Exercise per Session: 20 min  Stress:   . Feeling of Stress : Not on file  Social Connections: Unknown  . Frequency of Communication with Friends and Family: More than three times a week  . Frequency of Social Gatherings with Friends and Family: More than three times a week  . Attends Religious Services: Not on file  . Active Member of Clubs or Organizations: Not on file  . Attends Banker Meetings: Not on file  . Marital Status: Married    Outpatient Encounter Medications as of 07/27/2020  Medication Sig  . alfuzosin (UROXATRAL) 10 MG 24 hr tablet Take 1 tablet (10 mg total) by mouth daily with breakfast.  . aspirin 325 MG tablet Take 1 tablet (325 mg total) by mouth daily.  Marland Kitchen atorvastatin (LIPITOR) 40 MG tablet Take 1 tablet (40 mg total) by mouth daily.  Marland Kitchen lisinopril (ZESTRIL) 40 MG tablet Take 1 tablet (40 mg total) by mouth daily.  . MULTIPLE VITAMIN PO Take by mouth.    . topiramate (TOPAMAX) 50 MG tablet Take 1 tablet (50 mg total) by mouth daily.  . traZODone (DESYREL) 50 MG tablet Take 1-2 tablets (50-100 mg total) by mouth at bedtime as needed. for sleep  . zolpidem (AMBIEN) 10 MG tablet TAKE 1 TABLET BY MOUTH AT BEDTIME AS NEEDED  FOR SLEEP  . [DISCONTINUED] alfuzosin (UROXATRAL) 10 MG 24 hr tablet Take 1 tablet (10 mg total) by mouth daily with breakfast.  . [DISCONTINUED] aspirin 325 MG tablet Take 1 tablet (325 mg total) by mouth daily.  . [DISCONTINUED] atorvastatin (LIPITOR) 40 MG tablet Take 1 tablet (40 mg total) by mouth daily.  . [DISCONTINUED] lisinopril (ZESTRIL) 40 MG tablet Take 1 tablet (40 mg total) by mouth daily.  . [DISCONTINUED] sildenafil (REVATIO) 20 MG tablet Take 1-4 tablets (20-80 mg total) by mouth as needed (prior to sex).  . [DISCONTINUED] topiramate (TOPAMAX) 50 MG tablet Take 1 tablet (50 mg total) by mouth daily.  . [DISCONTINUED] traZODone (DESYREL) 50 MG tablet  Take 1-2 tablets (50-100 mg total) by mouth at bedtime as needed. for sleep   No facility-administered encounter medications on file as of 07/27/2020.    Activities of Daily Living In your present state of health, do you have any difficulty performing the following activities: 07/27/2020  Hearing? Y  Vision? N  Difficulty concentrating or making decisions? N  Walking or climbing stairs? N  Dressing or bathing? N  Doing errands, shopping? N  Preparing Food and eating ? N  Using the Toilet? N  In the past six months, have you accidently leaked urine? N  Do you have problems with loss of bowel control? N  Managing your Medications? N  Managing your Finances? N  Housekeeping or managing your Housekeeping? N  Some recent data might be hidden    Patient Care Team: Sunnie Nielsen, DO as PCP - General (Osteopathic Medicine)   Assessment:   This is a routine wellness examination for Guss.  Exercise Activities and Dietary recommendations Current Exercise Habits: The patient has a physically strenuous job, but has no regular exercise apart from work.  Goals    . Weight (lb) < 200 lb (90.7 kg)     Would like to loose 20lbs and be more active.       Fall Risk Fall Risk  07/27/2020 07/22/2019 07/18/2018 11/12/2013   Falls in the past year? 0 0 0 No  Number falls in past yr: 0 0 - -  Injury with Fall? 0 0 - -  Follow up Falls evaluation completed Falls prevention discussed - -   Is the patient's home free of loose throw rugs in walkways, pet beds, electrical cords, etc?   yes      Grab bars in the bathroom? yes      Handrails on the stairs?   yes      Adequate lighting?   yes  Timed Get Up and Go Performed: yes 3 seconds for 10 feet with steady gait and no assistive devices Patient rating of health (0-10): 10   Depression Screen PHQ 2/9 Scores 07/27/2020 01/16/2019 07/18/2018 07/18/2018  PHQ - 2 Score 0 0 0 0  PHQ- 9 Score - - - -    Cognitive Function     6CIT Screen 07/27/2020 07/22/2019 07/18/2018 07/10/2017  What Year? 0 points 0 points 0 points 0 points  What month? 0 points 0 points 0 points 0 points  What time? 0 points 0 points 0 points 0 points  Count back from 20 0 points 0 points 0 points 0 points  Months in reverse 0 points 2 points 2 points 0 points  Repeat phrase 0 points 0 points 0 points 4 points  Total Score 0 2 2 4     Immunization History  Administered Date(s) Administered  . 19-influenza Whole 05/21/2019  . Influenza Split 05/31/2011, 07/17/2015, 06/03/2019  . Influenza, High Dose Seasonal PF 07/10/2017, 06/28/2018, 05/21/2019  . Influenza,inj,Quad PF,6+ Mos 11/12/2013, 06/17/2016  . Influenza-Unspecified 07/17/2015, 07/10/2017, 06/28/2018, 06/03/2019, 07/06/2020  . PFIZER SARS-COV-2 Vaccination 12/09/2019, 01/02/2020  . Pneumococcal Conjugate-13 07/17/2015  . Pneumococcal Polysaccharide-23 03/05/2009  . Pneumococcal-Unspecified 03/05/2009  . Td 09/05/2004  . Tdap 06/17/2016    Screening Tests Health Maintenance  Topic Date Due  . Hepatitis C Screening  Never done  . TETANUS/TDAP  06/17/2026  . INFLUENZA VACCINE  Completed  . COVID-19 Vaccine  Completed  . PNA vac Low Risk Adult  Completed   Cancer Screenings: Lung: Low Dose CT Chest recommended if Age  55-80 years, 30 pack-year currently smoking OR have quit w/in 15years. Patient does not qualify. Colorectal: done      Plan:  .1. Hyperlipidemia LDL goal <70 History of hyperlipidemia.  Will obtain labs today. - HgB A1c - CBC with Differential/Platelet - COMPLETE METABOLIC PANEL WITH GFR - Lipid panel  2. Cerebrovascular accident (CVA), unspecified mechanism (HCC) New symptoms or new complaints today.  No residual effects.  Will obtain labs today. - HgB A1c - CBC with Differential/Platelet - COMPLETE METABOLIC PANEL WITH GFR - Lipid panel  3. Essential hypertension, benign Blood pressure well controlled today.  Refills provided.  Will obtain labs today.  No current complaints or symptoms noted. - HgB A1c - CBC with Differential/Platelet - COMPLETE METABOLIC PANEL WITH GFR - Lipid panel - lisinopril (ZESTRIL) 40 MG tablet; Take 1 tablet (40 mg total) by mouth daily.  Dispense: 90 tablet; Refill: 3  4. Coronary artery disease involving native coronary artery of native heart without angina pectoris We will obtain labs today.  No current complaints or symptoms noted. - HgB A1c - CBC with Differential/Platelet - COMPLETE METABOLIC PANEL WITH GFR - Lipid panel  5. Encounter for Medicare annual wellness exam Annual Medicare wellness complete. Patient has been determined to be physically inactive.  He does have limited physical activity daily but due to his current job he is limited on the amount of time that he has for physical activity. Discussed increasing physical activity. Up-to-date on vaccines.   6. Overactive bladder Refill provided today for patient.  Medication working well for patient.  No side effects or new symptoms noted. - alfuzosin (UROXATRAL) 10 MG 24 hr tablet; Take 1 tablet (10 mg total) by mouth daily with breakfast.  Dispense: 90 tablet; Refill: 3  7. Benign prostatic hyperplasia, unspecified whether lower urinary tract symptoms present Refill provided  today for patient.  Medication working well for patient. - alfuzosin (UROXATRAL) 10 MG 24 hr tablet; Take 1 tablet (10 mg total) by mouth daily with breakfast.  Dispense: 90 tablet; Refill: 3  8. Nonintractable headache, unspecified chronicity pattern, unspecified headache type Refill provided today for patient.  Medication working well for patient.  No symptoms or side effects noted. - topiramate (TOPAMAX) 50 MG tablet; Take 1 tablet (50 mg total) by mouth daily.  Dispense: 90 tablet; Refill: 3  9. Other insomnia Refill provided today on medication.  Medication working well for patient.  No symptoms or side effects noted. - traZODone (DESYREL) 50 MG tablet; Take 1-2 tablets (50-100 mg total) by mouth at bedtime as needed. for sleep  Dispense: 90 tablet; Refill: 1   I have personally reviewed and noted the following in the patient's chart:   . Medical and social history . Use of alcohol, tobacco or illicit drugs  . Current medications and supplements . Functional ability and status . Nutritional status . Physical activity . Advanced directives . List of other physicians . Hospitalizations, surgeries, and ER visits in previous 12 months . Vitals . Screenings to include cognitive, depression, and falls . Referrals and appointments  In addition, I have reviewed and discussed with patient certain preventive protocols, quality metrics, and best practice recommendations. A written personalized care plan for preventive services as well as general preventive health recommendations were provided to patient.    This visit was conducted virtually in the setting of the COVID19 pandemic.    Tollie EthSara E. Shilah Hefel, NP  07/27/2020

## 2020-07-27 NOTE — Patient Instructions (Addendum)
You are up to date on all of your vaccinations.     Fat and Cholesterol Restricted Eating Plan Eating a diet that limits fat and cholesterol may help lower your risk for heart disease and other conditions. Your body needs fat and cholesterol for basic functions, but eating too much of these things can be harmful to your health. Your health care provider may order lab tests to check your blood fat (lipid) and cholesterol levels. This helps your health care provider understand your risk for certain conditions and whether you need to make diet changes. Work with your health care provider or dietitian to make an eating plan that is right for you. Your plan includes:  Limit your fat intake to ______% or less of your total calories a day.  Limit your saturated fat intake to ______% or less of your total calories a day.  Limit the amount of cholesterol in your diet to less than _________mg a day.  Eat ___________ g of fiber a day. What are tips for following this plan? General guidelines   If you are overweight, work with your health care provider to lose weight safely. Losing just 5-10% of your body weight can improve your overall health and help prevent diseases such as diabetes and heart disease.  Avoid: ? Foods with added sugar. ? Fried foods. ? Foods that contain partially hydrogenated oils, including stick margarine, some tub margarines, cookies, crackers, and other baked goods.  Limit alcohol intake to no more than 1 drink a day for nonpregnant women and 2 drinks a day for men. One drink equals 12 oz of beer, 5 oz of wine, or 1 oz of hard liquor. Reading food labels  Check food labels for: ? Trans fats, partially hydrogenated oils, or high amounts of saturated fat. Avoid foods that contain saturated fat and trans fat. ? The amount of cholesterol in each serving. Try to eat no more than 200 mg of cholesterol each day. ? The amount of fiber in each serving. Try to eat at least 20-30 g  of fiber each day.  Choose foods with healthy fats, such as: ? Monounsaturated and polyunsaturated fats. These include olive and canola oil, flaxseeds, walnuts, almonds, and seeds. ? Omega-3 fats. These are found in foods such as salmon, mackerel, sardines, tuna, flaxseed oil, and ground flaxseeds.  Choose grain products that have whole grains. Look for the word "whole" as the first word in the ingredient list. Cooking  Cook foods using methods other than frying. Baking, boiling, grilling, and broiling are some healthy options.  Eat more home-cooked food and less restaurant, buffet, and fast food.  Avoid cooking using saturated fats. ? Animal sources of saturated fats include meats, butter, and cream. ? Plant sources of saturated fats include palm oil, palm kernel oil, and coconut oil. Meal planning   At meals, imagine dividing your plate into fourths: ? Fill one-half of your plate with vegetables and green salads. ? Fill one-fourth of your plate with whole grains. ? Fill one-fourth of your plate with lean protein foods.  Eat fish that is high in omega-3 fats at least two times a week.  Eat more foods that contain fiber, such as whole grains, beans, apples, broccoli, carrots, peas, and barley. These foods help promote healthy cholesterol levels in the blood. Recommended foods Grains  Whole grains, such as whole wheat or whole grain breads, crackers, cereals, and pasta. Unsweetened oatmeal, bulgur, barley, quinoa, or brown rice. Corn or whole wheat flour tortillas.  Vegetables  Fresh or frozen vegetables (raw, steamed, roasted, or grilled). Green salads. Fruits  All fresh, canned (in natural juice), or frozen fruits. Meats and other protein foods  Ground beef (85% or leaner), grass-fed beef, or beef trimmed of fat. Skinless chicken or [place]. Ground chicken or [place]. Pork trimmed of fat. All fish and seafood. Egg whites. Dried beans, peas, or lentils. Unsalted nuts or seeds.  Unsalted canned beans. Natural nut butters without added sugar and oil. Dairy  Low-fat or nonfat dairy products, such as skim or 1% milk, 2% or reduced-fat cheeses, low-fat and fat-free ricotta or cottage cheese, or plain low-fat and nonfat yogurt. Fats and oils  Tub margarine without trans fats. Light or reduced-fat mayonnaise and salad dressings. Avocado. Olive, canola, sesame, or safflower oils. The items listed above may not be a complete list of recommended foods or beverages. Contact your dietitian for more options. Foods to avoid Grains  White bread. White pasta. White rice. Cornbread. Bagels, pastries, and croissants. Crackers and snack foods that contain trans fat and hydrogenated oils. Vegetables  Vegetables cooked in cheese, cream, or butter sauce. Fried vegetables. Fruits  Canned fruit in heavy syrup. Fruit in cream or butter sauce. Fried fruit. Meats and other protein foods  Fatty cuts of meat. Ribs, chicken wings, bacon, sausage, bologna, salami, chitterlings, fatback, hot dogs, bratwurst, and packaged lunch meats. Liver and organ meats. Whole eggs and egg yolks. Chicken and [place] with skin. Fried meat. Dairy  Whole or 2% milk, cream, half-and-half, and cream cheese. Whole milk cheeses. Whole-fat or sweetened yogurt. Full-fat cheeses. Nondairy creamers and whipped toppings. Processed cheese, cheese spreads, and cheese curds. Beverages  Alcohol. Sugar-sweetened drinks such as sodas, lemonade, and fruit drinks. Fats and oils  Butter, stick margarine, lard, shortening, ghee, or bacon fat. Coconut, palm kernel, and palm oils. Sweets and desserts  Corn syrup, sugars, honey, and molasses. Candy. Jam and jelly. Syrup. Sweetened cereals. Cookies, pies, cakes, donuts, muffins, and ice cream. The items listed above may not be a complete list of foods and beverages to avoid. Contact your dietitian for more information. Summary  Your body needs fat and cholesterol for basic  functions. However, eating too much of these things can be harmful to your health.  Work with your health care provider and dietitian to follow a diet low in fat and cholesterol. Doing this may help lower your risk for heart disease and other conditions.  Choose healthy fats, such as monounsaturated and polyunsaturated fats, and foods high in omega-3 fatty acids.  Eat fiber-rich foods, such as whole grains, beans, peas, fruits, and vegetables.  Limit or avoid alcohol, fried foods, and foods high in saturated fats, partially hydrogenated oils, and sugar. This information is not intended to replace advice given to you by your health care provider. Make sure you discuss any questions you have with your health care provider. Document Revised: 08/04/2017 Document Reviewed: 05/09/2017 Elsevier Patient Education  2020 [employer]   Health Maintenance, Male Adopting a healthy lifestyle and getting preventive care are important in promoting health and wellness. Ask your health care provider about:  The right schedule for you to have regular tests and exams.  Things you can do on your own to prevent diseases and keep yourself healthy. What should I know about diet, weight, and exercise? Eat a healthy diet   Eat a diet that includes plenty of vegetables, fruits, low-fat dairy products, and lean protein.  Do not eat a lot of foods that are  high in solid fats, added sugars, or sodium. Maintain a healthy weight Body mass index (BMI) is a measurement that can be used to identify possible weight problems. It estimates body fat based on height and weight. Your health care provider can help determine your BMI and help you achieve or maintain a healthy weight. Get regular exercise Get regular exercise. This is one of the most important things you can do for your health. Most adults should:  Exercise for at least 150 minutes each week. The exercise should increase your heart rate and make you sweat  (moderate-intensity exercise).  Do strengthening exercises at least twice a week. This is in addition to the moderate-intensity exercise.  Spend less time sitting. Even light physical activity can be beneficial. Watch cholesterol and blood lipids Have your blood tested for lipids and cholesterol at 77 years of age, then have this test every 5 years. You may need to have your cholesterol levels checked more often if:  Your lipid or cholesterol levels are high.  You are older than 77 years of age.  You are at high risk for heart disease. What should I know about cancer screening? Many types of cancers can be detected Miguel Glenn and may often be prevented. Depending on your health history and family history, you may need to have cancer screening at various ages. This may include screening for:  Colorectal cancer.  Prostate cancer.  Skin cancer.  Lung cancer. What should I know about heart disease, diabetes, and high blood pressure? Blood pressure and heart disease  High blood pressure causes heart disease and increases the risk of stroke. This is more likely to develop in people who have high blood pressure readings, are of African descent, or are overweight.  Talk with your health care provider about your target blood pressure readings.  Have your blood pressure checked: ? Every 3-5 years if you are 74-42 years of age. ? Every year if you are 70 years old or older.  If you are between the ages of 84 and 25 and are a current or former smoker, ask your health care provider if you should have a one-time screening for abdominal aortic aneurysm (AAA). Diabetes Have regular diabetes screenings. This checks your fasting blood sugar level. Have the screening done:  Once every three years after age 62 if you are at a normal weight and have a low risk for diabetes.  More often and at a younger age if you are overweight or have a high risk for diabetes. What should I know about preventing  infection? Hepatitis B If you have a higher risk for hepatitis B, you should be screened for this virus. Talk with your health care provider to find out if you are at risk for hepatitis B infection. Hepatitis C Blood testing is recommended for:  Everyone born from [birth year] through 1963.  Anyone with known risk factors for hepatitis C. Sexually transmitted infections (STIs)  You should be screened each year for STIs, including gonorrhea and chlamydia, if: ? You are sexually active and are younger than 77 years of age. ? You are older than 77 years of age and your health care provider tells you that you are at risk for this type of infection. ? Your sexual activity has changed since you were last screened, and you are at increased risk for chlamydia or gonorrhea. Ask your health care provider if you are at risk.  Ask your health care provider about whether you are at high risk  for HIV. Your health care provider may recommend a prescription medicine to help prevent HIV infection. If you choose to take medicine to prevent HIV, you should first get tested for HIV. You should then be tested every 3 months for as long as you are taking the medicine. Follow these instructions at home: Lifestyle  Do not use any products that contain nicotine or tobacco, such as cigarettes, e-cigarettes, and chewing tobacco. If you need help quitting, ask your health care provider.  Do not use street drugs.  Do not share needles.  Ask your health care provider for help if you need support or information about quitting drugs. Alcohol use  Do not drink alcohol if your health care provider tells you not to drink.  If you drink alcohol: ? Limit how much you have to 0-2 drinks a day. ? Be aware of how much alcohol is in your drink. In the [place] one drink equals one 12 oz bottle of beer (355 mL), one 5 oz glass of wine (148 mL), or one 1 oz glass of hard liquor (44 mL). General instructions  Schedule regular health,  dental, and eye exams.  Stay current with your vaccines.  Tell your health care provider if: ? You often feel depressed. ? You have ever been abused or do not feel safe at home. Summary  Adopting a healthy lifestyle and getting preventive care are important in promoting health and wellness.  Follow your health care provider's instructions about healthy diet, exercising, and getting tested or screened for diseases.  Follow your health care provider's instructions on monitoring your cholesterol and blood pressure. This information is not intended to replace advice given to you by your health care provider. Make sure you discuss any questions you have with your health care provider. Document Revised: 08/15/2018 Document Reviewed: 08/15/2018 Elsevier Patient Education  2020 [employer].   Hearing Loss Hearing loss is a partial or total loss of the ability to hear. This can be temporary or permanent, and it can happen in one or both ears. Medical care is necessary to treat hearing loss properly and to prevent the condition from getting worse. Your hearing may partially or completely come back, depending on what caused your hearing loss and how severe it is. In some cases, hearing loss is permanent. What are the causes? Common causes of hearing loss include:  Too much wax in the ear canal.  Infection of the ear canal or middle ear.  Fluid in the middle ear.  Injury to the ear or surrounding area.  An object stuck in the ear.  A history of prolonged exposure to loud sounds, such as music. Less common causes of hearing loss include:  Tumors in the ear.  Viral or bacterial infections, such as meningitis.  A hole in the eardrum (perforated eardrum).  Problems with the hearing nerve that sends signals between the brain and the ear.  Certain medicines. What are the signs or symptoms? Symptoms of this condition may include:  Difficulty telling the difference between  sounds.  Difficulty following a conversation when there is background noise.  Lack of response to sounds in your environment. This may be most noticeable when you do not respond to startling sounds.  Needing to turn up the volume on the television, radio, or other devices.  Ringing in the ears.  Dizziness. How is this diagnosed? This condition is diagnosed based on:  A physical exam.  A hearing test (audiometry). The audiometry test will be performed  by a hearing specialist (audiologist). You may also be referred to an ear, nose, and throat (ENT) specialist (otolaryngologist). How is this treated? Treatment for hearing loss may include:  Ear wax removal.  Medicines to treat or prevent infection (antibiotics).  Medicines to reduce inflammation (corticosteroids).  Hearing aids for hearing loss related to nerve damage. Follow these instructions at home:  If you were prescribed an antibiotic medicine, take it as told by your health care provider. Do not stop taking the antibiotic even if you start to feel better.  Take over-the-counter and prescription medicines only as told by your health care provider.  Avoid loud noises.  Return to your normal activities as told by your health care provider. Ask your health care provider what activities are safe for you.  Keep all follow-up visits as told by your health care provider. This is important. Contact a health care provider if:  You feel dizzy.  You develop new symptoms.  You vomit or feel nauseous.  You have a fever. Get help right away if:  You develop sudden changes in your vision.  You have severe ear pain.  You have new or increased weakness.  You have a severe headache. Summary  Hearing loss is a decreased ability to hear sounds around you. It can be temporary or permanent.  Treatment will depend on the cause of your hearing loss. It may include ear wax removal, medicines, or a hearing aid.  Your hearing  may partially or completely come back, depending on what caused your hearing loss and how severe it is.  Keep all follow-up visits as told by your health care provider. This is important. This information is not intended to replace advice given to you by your health care provider. Make sure you discuss any questions you have with your health care provider. Document Revised: 05/22/2018 Document Reviewed: 05/22/2018 Elsevier Patient Education  2020 [employer].

## 2020-07-28 LAB — CBC WITH DIFFERENTIAL/PLATELET
Absolute Monocytes: 395 cells/uL (ref 200–950)
Basophils Absolute: 29 cells/uL (ref 0–200)
Basophils Relative: 0.7 %
Eosinophils Absolute: 130 cells/uL (ref 15–500)
Eosinophils Relative: 3.1 %
HCT: 45.6 % (ref 38.5–50.0)
Hemoglobin: 15 g/dL (ref 13.2–17.1)
Lymphs Abs: 1567 cells/uL (ref 850–3900)
MCH: 30.7 pg (ref 27.0–33.0)
MCHC: 32.9 g/dL (ref 32.0–36.0)
MCV: 93.4 fL (ref 80.0–100.0)
MPV: 10.7 fL (ref 7.5–12.5)
Monocytes Relative: 9.4 %
Neutro Abs: 2079 cells/uL (ref 1500–7800)
Neutrophils Relative %: 49.5 %
Platelets: 195 10*3/uL (ref 140–400)
RBC: 4.88 10*6/uL (ref 4.20–5.80)
RDW: 12.3 % (ref 11.0–15.0)
Total Lymphocyte: 37.3 %
WBC: 4.2 10*3/uL (ref 3.8–10.8)

## 2020-07-28 LAB — HEMOGLOBIN A1C
Hgb A1c MFr Bld: 5.9 % of total Hgb — ABNORMAL HIGH (ref <5.7)
Mean Plasma Glucose: 123 (calc)
eAG (mmol/L): 6.8 (calc)

## 2020-07-28 LAB — LIPID PANEL
Cholesterol: 106 mg/dL
HDL: 48 mg/dL
LDL Cholesterol (Calc): 44 mg/dL
Non-HDL Cholesterol (Calc): 58 mg/dL
Total CHOL/HDL Ratio: 2.2 (calc)
Triglycerides: 68 mg/dL

## 2020-07-28 LAB — COMPLETE METABOLIC PANEL WITHOUT GFR
AG Ratio: 1.9 (calc) (ref 1.0–2.5)
ALT: 19 U/L (ref 9–46)
AST: 17 U/L (ref 10–35)
Albumin: 4.6 g/dL (ref 3.6–5.1)
Alkaline phosphatase (APISO): 82 U/L (ref 35–144)
BUN: 21 mg/dL (ref 7–25)
CO2: 27 mmol/L (ref 20–32)
Calcium: 9.4 mg/dL (ref 8.6–10.3)
Chloride: 105 mmol/L (ref 98–110)
Creat: 1.11 mg/dL (ref 0.70–1.18)
GFR, Est African American: 74 mL/min/1.73m2
GFR, Est Non African American: 64 mL/min/1.73m2
Globulin: 2.4 g/dL (ref 1.9–3.7)
Glucose, Bld: 88 mg/dL (ref 65–99)
Potassium: 5 mmol/L (ref 3.5–5.3)
Sodium: 140 mmol/L (ref 135–146)
Total Bilirubin: 0.8 mg/dL (ref 0.2–1.2)
Total Protein: 7 g/dL (ref 6.1–8.1)

## 2020-07-28 LAB — SPECIMEN COMPROMISED

## 2020-07-28 NOTE — Progress Notes (Signed)
Hemoglobin A1c was 5.9%, which is consistent with pre-diabetes.   Recommend weight loss, diet low in carbohydrates and saturated fats, and increased exercise to help reduce transition into diabetes.   Monitor again in 12 months or sooner if needed.

## 2020-10-08 DIAGNOSIS — G629 Polyneuropathy, unspecified: Secondary | ICD-10-CM | POA: Diagnosis not present

## 2020-10-08 DIAGNOSIS — I739 Peripheral vascular disease, unspecified: Secondary | ICD-10-CM | POA: Diagnosis not present

## 2020-10-09 ENCOUNTER — Other Ambulatory Visit: Payer: Self-pay | Admitting: Osteopathic Medicine

## 2020-10-09 DIAGNOSIS — G4700 Insomnia, unspecified: Secondary | ICD-10-CM

## 2020-10-13 DIAGNOSIS — G629 Polyneuropathy, unspecified: Secondary | ICD-10-CM | POA: Diagnosis not present

## 2020-10-26 DIAGNOSIS — G629 Polyneuropathy, unspecified: Secondary | ICD-10-CM | POA: Diagnosis not present

## 2020-10-29 DIAGNOSIS — G629 Polyneuropathy, unspecified: Secondary | ICD-10-CM | POA: Diagnosis not present

## 2020-12-16 DIAGNOSIS — K5730 Diverticulosis of large intestine without perforation or abscess without bleeding: Secondary | ICD-10-CM | POA: Diagnosis not present

## 2020-12-16 DIAGNOSIS — R933 Abnormal findings on diagnostic imaging of other parts of digestive tract: Secondary | ICD-10-CM | POA: Diagnosis not present

## 2020-12-16 DIAGNOSIS — F32A Depression, unspecified: Secondary | ICD-10-CM | POA: Diagnosis not present

## 2020-12-16 DIAGNOSIS — I2510 Atherosclerotic heart disease of native coronary artery without angina pectoris: Secondary | ICD-10-CM | POA: Diagnosis not present

## 2020-12-16 DIAGNOSIS — E872 Acidosis: Secondary | ICD-10-CM | POA: Diagnosis not present

## 2020-12-16 DIAGNOSIS — J8410 Pulmonary fibrosis, unspecified: Secondary | ICD-10-CM | POA: Diagnosis not present

## 2020-12-16 DIAGNOSIS — R55 Syncope and collapse: Secondary | ICD-10-CM | POA: Diagnosis not present

## 2020-12-16 DIAGNOSIS — R140 Abdominal distension (gaseous): Secondary | ICD-10-CM | POA: Diagnosis not present

## 2020-12-16 DIAGNOSIS — I10 Essential (primary) hypertension: Secondary | ICD-10-CM | POA: Diagnosis not present

## 2020-12-16 DIAGNOSIS — D62 Acute posthemorrhagic anemia: Secondary | ICD-10-CM | POA: Diagnosis not present

## 2020-12-16 DIAGNOSIS — J432 Centrilobular emphysema: Secondary | ICD-10-CM | POA: Diagnosis not present

## 2020-12-16 DIAGNOSIS — E876 Hypokalemia: Secondary | ICD-10-CM | POA: Diagnosis not present

## 2020-12-16 DIAGNOSIS — J9811 Atelectasis: Secondary | ICD-10-CM | POA: Diagnosis not present

## 2020-12-16 DIAGNOSIS — R1084 Generalized abdominal pain: Secondary | ICD-10-CM | POA: Diagnosis not present

## 2020-12-16 DIAGNOSIS — K3189 Other diseases of stomach and duodenum: Secondary | ICD-10-CM | POA: Diagnosis not present

## 2020-12-16 DIAGNOSIS — K921 Melena: Secondary | ICD-10-CM | POA: Diagnosis not present

## 2020-12-16 DIAGNOSIS — K6381 Dieulafoy lesion of intestine: Secondary | ICD-10-CM | POA: Diagnosis not present

## 2020-12-16 DIAGNOSIS — R4020 Unspecified coma: Secondary | ICD-10-CM | POA: Diagnosis not present

## 2020-12-16 DIAGNOSIS — K922 Gastrointestinal hemorrhage, unspecified: Secondary | ICD-10-CM | POA: Diagnosis not present

## 2020-12-16 DIAGNOSIS — R110 Nausea: Secondary | ICD-10-CM | POA: Diagnosis not present

## 2020-12-16 DIAGNOSIS — Z955 Presence of coronary angioplasty implant and graft: Secondary | ICD-10-CM | POA: Diagnosis not present

## 2020-12-16 DIAGNOSIS — D500 Iron deficiency anemia secondary to blood loss (chronic): Secondary | ICD-10-CM | POA: Diagnosis not present

## 2020-12-16 DIAGNOSIS — E860 Dehydration: Secondary | ICD-10-CM | POA: Diagnosis not present

## 2020-12-16 DIAGNOSIS — K838 Other specified diseases of biliary tract: Secondary | ICD-10-CM | POA: Diagnosis not present

## 2020-12-16 DIAGNOSIS — K529 Noninfective gastroenteritis and colitis, unspecified: Secondary | ICD-10-CM | POA: Diagnosis not present

## 2020-12-16 DIAGNOSIS — Z7982 Long term (current) use of aspirin: Secondary | ICD-10-CM | POA: Diagnosis not present

## 2020-12-16 DIAGNOSIS — R0902 Hypoxemia: Secondary | ICD-10-CM | POA: Diagnosis not present

## 2020-12-16 DIAGNOSIS — K3182 Dieulafoy lesion (hemorrhagic) of stomach and duodenum: Secondary | ICD-10-CM | POA: Diagnosis not present

## 2020-12-16 DIAGNOSIS — R109 Unspecified abdominal pain: Secondary | ICD-10-CM | POA: Diagnosis not present

## 2020-12-16 DIAGNOSIS — R7989 Other specified abnormal findings of blood chemistry: Secondary | ICD-10-CM | POA: Diagnosis not present

## 2020-12-16 DIAGNOSIS — N200 Calculus of kidney: Secondary | ICD-10-CM | POA: Diagnosis not present

## 2020-12-16 DIAGNOSIS — E870 Hyperosmolality and hypernatremia: Secondary | ICD-10-CM | POA: Diagnosis not present

## 2020-12-22 ENCOUNTER — Telehealth: Payer: Self-pay

## 2020-12-22 NOTE — Telephone Encounter (Signed)
Pt called requesting an hospital follow up appt with provider. Per pt, he was seen for an upper GI bleed. Thanks in advance.

## 2020-12-22 NOTE — Telephone Encounter (Signed)
Has the pt scheduled for 01/04/2021 @ 1:50-tvt .Marland Kitchen...hospital f/u for upper GL Bleed.

## 2021-01-04 ENCOUNTER — Other Ambulatory Visit: Payer: Self-pay

## 2021-01-04 ENCOUNTER — Encounter: Payer: Self-pay | Admitting: Osteopathic Medicine

## 2021-01-04 ENCOUNTER — Ambulatory Visit: Payer: Medicare PPO | Admitting: Osteopathic Medicine

## 2021-01-04 VITALS — BP 133/73 | HR 59 | Temp 98.1°F | Wt 248.1 lb

## 2021-01-04 DIAGNOSIS — K922 Gastrointestinal hemorrhage, unspecified: Secondary | ICD-10-CM

## 2021-01-04 DIAGNOSIS — G4709 Other insomnia: Secondary | ICD-10-CM | POA: Diagnosis not present

## 2021-01-04 DIAGNOSIS — G4700 Insomnia, unspecified: Secondary | ICD-10-CM | POA: Diagnosis not present

## 2021-01-04 MED ORDER — GABAPENTIN 100 MG PO CAPS: 200.0000 mg | ORAL_CAPSULE | Freq: Every day | ORAL | 3 refills | Status: DC

## 2021-01-04 MED ORDER — ZOLPIDEM TARTRATE 10 MG PO TABS: 10.0000 mg | ORAL_TABLET | Freq: Every evening | ORAL | 1 refills | Status: DC | PRN

## 2021-01-04 MED ORDER — TRAZODONE HCL 50 MG PO TABS: 50.0000 mg | ORAL_TABLET | Freq: Every evening | ORAL | 1 refills | Status: DC | PRN

## 2021-01-04 MED ORDER — ASPIRIN EC 81 MG PO TBEC: 81.0000 mg | DELAYED_RELEASE_TABLET | Freq: Every day | ORAL | 3 refills | Status: AC

## 2021-01-04 NOTE — Progress Notes (Signed)
Miguel Glenn is a 80 y.o. male who presents to  [facility]  today, 01/05/21, seeking care for the following:  . HFU: GI BLEED o Records reviewed, see care everywhere for discharge summary  o Taking PPI, needs to continue ASA d/t hx ASCVD. Has f/u w/ GI 01/13/2021 o Last CBC 12/18/20 Hgb 10.8, Hct 33.6 o No additional episodes bloody/dark stool or presyncope/lightheadedness.      ASSESSMENT & PLAN with other pertinent findings:  The primary encounter diagnosis was Gastrointestinal hemorrhage, unspecified gastrointestinal hemorrhage type. Diagnoses of Insomnia, unspecified type and Other insomnia were also pertinent to this visit.    There are no Patient Instructions on file for this visit.  Orders Placed This Encounter  Procedures  . CBC  . COMPLETE METABOLIC PANEL WITH GFR    Meds ordered this encounter  Medications  . aspirin EC 81 MG tablet    Sig: Take 1 tablet (81 mg total) by mouth daily.    Dispense:  90 tablet    Refill:  3  . gabapentin (NEURONTIN) 100 MG capsule    Sig: Take 2 capsules (200 mg total) by mouth at bedtime.    Dispense:  180 capsule    Refill:  3  . zolpidem (AMBIEN) 10 MG tablet    Sig: Take 1 tablet (10 mg total) by mouth at bedtime as needed. for sleep    Dispense:  90 tablet    Refill:  1  . traZODone (DESYREL) 50 MG tablet    Sig: Take 1-2 tablets (50-100 mg total) by mouth at bedtime as needed. for sleep    Dispense:  90 tablet    Refill:  1     See below for relevant physical exam findings  See below for recent lab and imaging results reviewed  Medications, allergies, PMH, PSH, SocH, FamH reviewed below    Follow-up instructions: Return in about 6 months (around 07/07/2021) for ANNUAL (call week prior to visit for lab orders), SEE [place] SOONER IF NEEDED. CALL/MESSAGE W/ QUESTIONS.                                        Exam:  BP 133/73  (BP Location: Left Arm, Patient Position: Sitting, Cuff Size: Normal)   Pulse (!) 59   Temp 98.1 F (36.7 C) (Oral)   Wt 248 lb 1.3 oz (112.5 kg)   BMI 30.60 kg/m   Constitutional: VS see above. General Appearance: alert, well-developed, well-nourished, NAD  Neck: No masses, trachea midline.   Respiratory: Normal respiratory effort. no wheeze, no rhonchi, no rales  Cardiovascular: S1/S2 normal, no murmur, no rub/gallop auscultated. RRR.   Musculoskeletal: Gait normal. Symmetric and independent movement of all extremities  Abdominal: non-tender, non-distended, no appreciable organomegaly, neg Murphy's, BS [id number]  Neurological: Normal balance/coordination. No tremor.  Skin: warm, dry, intact.   Psychiatric: Normal judgment/insight. Normal mood and affect. Oriented x3.   Current Meds  Medication Sig  . alfuzosin (UROXATRAL) 10 MG 24 hr tablet Take 1 tablet (10 mg total) by mouth daily with breakfast.  . aspirin 325 MG tablet Take 1 tablet (325 mg total) by mouth daily.  Marland Kitchen aspirin EC 81 MG tablet Take 1 tablet (81 mg total) by mouth daily.  Marland Kitchen atorvastatin (LIPITOR) 40 MG tablet Take 1 tablet (40 mg total) by mouth daily.  Marland Kitchen lisinopril (ZESTRIL)  40 MG tablet Take 1 tablet (40 mg total) by mouth daily.  . MULTIPLE VITAMIN PO Take by mouth.  . pantoprazole (PROTONIX) 40 MG tablet Take by mouth.  . topiramate (TOPAMAX) 50 MG tablet Take 1 tablet (50 mg total) by mouth daily.  . [DISCONTINUED] gabapentin (NEURONTIN) 100 MG capsule Take by mouth.  . [DISCONTINUED] traZODone (DESYREL) 50 MG tablet Take 1-2 tablets (50-100 mg total) by mouth at bedtime as needed. for sleep  . [DISCONTINUED] zolpidem (AMBIEN) 10 MG tablet TAKE 1 TABLET BY MOUTH AT BEDTIME AS NEEDED FOR SLEEP    No Known Allergies  Patient Active Problem List   Diagnosis Date Noted  . 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 Cerebrovascular accident (CVA) (HCC) 10/03/2018  . Overactive bladder 12/15/2015  . Annual physical exam  11/19/2015  . Systolic murmur 11/19/2015  . Hereditary and idiopathic peripheral neuropathy 06/04/2015  . Lumbar radiculopathy 05/22/2015  . Occipital neuralgia 05/13/2015  . Cataract 11/12/2013  . Hearing loss 11/12/2013  . BPH (benign prostatic hyperplasia) 10/30/2012  . Bradycardia 10/30/2012  . CAD (coronary artery disease) 10/30/2012  . Hyperlipidemia LDL goal <70 10/30/2012  . Essential hypertension, benign 10/30/2012  . Chronic pancreatitis (HCC) 02/24/2010  . INSOMNIA 02/14/2010  . Depression 02/02/2010    Family History  Problem Relation Age of Onset  . Lung cancer Mother   . Hypertension Mother   . Hyperlipidemia Mother   . Hypertension Sister     Social History   Tobacco Use  Smoking Status Former Smoker  Smokeless Tobacco Never Used  Tobacco Comment   quit 1996    Past Surgical History:  Procedure Laterality Date  . ABDOMINAL EXPLORATION SURGERY  1990s  . APPENDECTOMY    . CHOLECYSTECTOMY    . HIATAL HERNIA REPAIR  1991  . r shoulder replacement  12/10   Dr. Marlyne Beards  . RTC surgery bilat      Immunization History  Administered Date(s) Administered  . 19-influenza Whole 05/21/2019  . Influenza Split 05/31/2011, 07/17/2015, 06/03/2019  . Influenza, High Dose Seasonal PF 07/10/2017, 06/28/2018, 05/21/2019  . Influenza,inj,Quad PF,6+ Mos 11/12/2013, 06/17/2016  . Influenza-Unspecified 07/17/2015, 07/10/2017, 06/28/2018, 06/03/2019, 07/06/2020  . PFIZER(Purple Top)SARS-COV-2 Vaccination 12/09/2019, 01/02/2020, 08/25/2020  . Pneumococcal Conjugate-13 07/17/2015  . Pneumococcal Polysaccharide-23 03/05/2009  . Pneumococcal-Unspecified 03/05/2009  . Td 09/05/2004  . Tdap 06/17/2016    Recent Results (from the past 2160 hour(s))  CBC     Status: Abnormal   Collection Time: 01/04/21 12:00 AM  Result Value Ref Range   WBC 3.1 (L) 3.8 - 10.8 Thousand/uL   RBC 4.11 (L) 4.20 - 5.80 Million/uL   Hemoglobin 12.7 (L) 13.2 - 17.1 g/dL   HCT 41.9 37.9 - 02.4  %   MCV 96.6 80.0 - 100.0 fL   MCH 30.9 27.0 - 33.0 pg   MCHC 32.0 32.0 - 36.0 g/dL   RDW 09.7 35.3 - 29.9 %   Platelets 194 140 - 400 Thousand/uL   MPV 10.0 7.5 - 12.5 fL  COMPLETE METABOLIC PANEL WITH GFR     Status: None   Collection Time: 01/04/21 12:00 AM  Result Value Ref Range   Glucose, Bld 97 65 - 99 mg/dL    Comment: .            Fasting reference interval .    BUN 16 7 - 25 mg/dL   Creat 2.42 6.83 - 4.19 mg/dL    Comment: For patients >68 years of age, the reference limit for Creatinine  is approximately 13% higher for people identified as African-American. .    GFR, Est Non African American 72 > OR = 60 mL/min/1.54m2   GFR, Est African American 84 > OR = 60 mL/min/1.77m2   BUN/Creatinine Ratio NOT APPLICABLE 6 - 22 (calc)   Sodium 141 135 - 146 mmol/L   Potassium 4.0 3.5 - 5.3 mmol/L   Chloride 109 98 - 110 mmol/L   CO2 26 20 - 32 mmol/L   Calcium 8.9 8.6 - 10.3 mg/dL   Total Protein 6.5 6.1 - 8.1 g/dL   Albumin 4.3 3.6 - 5.1 g/dL   Globulin 2.2 1.9 - 3.7 g/dL (calc)   AG Ratio 2.0 1.0 - 2.5 (calc)   Total Bilirubin 0.6 0.2 - 1.2 mg/dL   Alkaline phosphatase (APISO) 92 35 - 144 U/L   AST 15 10 - 35 U/L   ALT 16 9 - 46 U/L    No results found.     All questions at time of visit were answered - patient instructed to contact office with any additional concerns or updates. ER/RTC precautions were reviewed with the patient as applicable.   Please note: manual typing as well as voice recognition software may have been used to produce this document - typos may escape review. Please contact Dr. Lyn Hollingshead for any needed clarifications.

## 2021-01-05 LAB — COMPLETE METABOLIC PANEL WITH GFR
AG Ratio: 2 (calc) (ref 1.0–2.5)
ALT: 16 U/L (ref 9–46)
AST: 15 U/L (ref 10–35)
Albumin: 4.3 g/dL (ref 3.6–5.1)
Alkaline phosphatase (APISO): 92 U/L (ref 35–144)
BUN: 16 mg/dL (ref 7–25)
CO2: 26 mmol/L (ref 20–32)
Calcium: 8.9 mg/dL (ref 8.6–10.3)
Chloride: 109 mmol/L (ref 98–110)
Creat: 1 mg/dL (ref 0.70–1.18)
GFR, Est African American: 84 mL/min/{1.73_m2} (ref >=60)
GFR, Est Non African American: 72 mL/min/{1.73_m2} (ref >=60)
Globulin: 2.2 g/dL (calc) (ref 1.9–3.7)
Glucose, Bld: 97 mg/dL (ref 65–99)
Potassium: 4 mmol/L (ref 3.5–5.3)
Sodium: 141 mmol/L (ref 135–146)
Total Bilirubin: 0.6 mg/dL (ref 0.2–1.2)
Total Protein: 6.5 g/dL (ref 6.1–8.1)

## 2021-01-05 LAB — CBC
HCT: 39.7 % (ref 38.5–50.0)
Hemoglobin: 12.7 g/dL — ABNORMAL LOW (ref 13.2–17.1)
MCH: 30.9 pg (ref 27.0–33.0)
MCHC: 32 g/dL (ref 32.0–36.0)
MCV: 96.6 fL (ref 80.0–100.0)
MPV: 10 fL (ref 7.5–12.5)
Platelets: 194 10*3/uL (ref 140–400)
RBC: 4.11 10*6/uL — ABNORMAL LOW (ref 4.20–5.80)
RDW: 13.2 % (ref 11.0–15.0)
WBC: 3.1 10*3/uL — ABNORMAL LOW (ref 3.8–10.8)

## 2021-01-13 DIAGNOSIS — K922 Gastrointestinal hemorrhage, unspecified: Secondary | ICD-10-CM | POA: Diagnosis not present

## 2021-01-20 ENCOUNTER — Ambulatory Visit: Payer: Medicare PPO | Admitting: Osteopathic Medicine

## 2021-04-24 DIAGNOSIS — R509 Fever, unspecified: Secondary | ICD-10-CM | POA: Diagnosis not present

## 2021-04-24 DIAGNOSIS — R519 Headache, unspecified: Secondary | ICD-10-CM | POA: Diagnosis not present

## 2021-04-24 DIAGNOSIS — Z03818 Encounter for observation for suspected exposure to other biological agents ruled out: Secondary | ICD-10-CM | POA: Diagnosis not present

## 2021-05-04 DIAGNOSIS — Z024 Encounter for examination for driving license: Secondary | ICD-10-CM | POA: Diagnosis not present

## 2021-05-04 DIAGNOSIS — I10 Essential (primary) hypertension: Secondary | ICD-10-CM | POA: Diagnosis not present

## 2021-05-04 DIAGNOSIS — E785 Hyperlipidemia, unspecified: Secondary | ICD-10-CM | POA: Diagnosis not present

## 2021-05-04 DIAGNOSIS — Z955 Presence of coronary angioplasty implant and graft: Secondary | ICD-10-CM | POA: Diagnosis not present

## 2021-05-04 DIAGNOSIS — I2510 Atherosclerotic heart disease of native coronary artery without angina pectoris: Secondary | ICD-10-CM | POA: Diagnosis not present

## 2021-05-05 DIAGNOSIS — M2391 Unspecified internal derangement of right knee: Secondary | ICD-10-CM | POA: Diagnosis not present

## 2021-05-05 DIAGNOSIS — M25561 Pain in right knee: Secondary | ICD-10-CM | POA: Diagnosis not present

## 2021-05-06 DIAGNOSIS — R29818 Other symptoms and signs involving the nervous system: Secondary | ICD-10-CM | POA: Diagnosis not present

## 2021-05-14 DIAGNOSIS — Z0289 Encounter for other administrative examinations: Secondary | ICD-10-CM | POA: Diagnosis not present

## 2021-05-18 DIAGNOSIS — M1711 Unilateral primary osteoarthritis, right knee: Secondary | ICD-10-CM | POA: Diagnosis not present

## 2021-05-18 DIAGNOSIS — M65861 Other synovitis and tenosynovitis, right lower leg: Secondary | ICD-10-CM | POA: Diagnosis not present

## 2021-05-18 DIAGNOSIS — M23321 Other meniscus derangements, posterior horn of medial meniscus, right knee: Secondary | ICD-10-CM | POA: Diagnosis not present

## 2021-05-18 DIAGNOSIS — S83271A Complex tear of lateral meniscus, current injury, right knee, initial encounter: Secondary | ICD-10-CM | POA: Diagnosis not present

## 2021-05-18 DIAGNOSIS — M238X1 Other internal derangements of right knee: Secondary | ICD-10-CM | POA: Diagnosis not present

## 2021-05-18 DIAGNOSIS — M25461 Effusion, right knee: Secondary | ICD-10-CM | POA: Diagnosis not present

## 2021-05-18 DIAGNOSIS — M23361 Other meniscus derangements, other lateral meniscus, right knee: Secondary | ICD-10-CM | POA: Diagnosis not present

## 2021-05-18 DIAGNOSIS — S83241A Other tear of medial meniscus, current injury, right knee, initial encounter: Secondary | ICD-10-CM | POA: Diagnosis not present

## 2021-05-18 DIAGNOSIS — M2341 Loose body in knee, right knee: Secondary | ICD-10-CM | POA: Diagnosis not present

## 2021-06-02 DIAGNOSIS — S83231A Complex tear of medial meniscus, current injury, right knee, initial encounter: Secondary | ICD-10-CM | POA: Diagnosis not present

## 2021-06-02 DIAGNOSIS — S83271A Complex tear of lateral meniscus, current injury, right knee, initial encounter: Secondary | ICD-10-CM | POA: Diagnosis not present

## 2021-06-07 DIAGNOSIS — M2341 Loose body in knee, right knee: Secondary | ICD-10-CM | POA: Diagnosis not present

## 2021-06-07 DIAGNOSIS — S83271A Complex tear of lateral meniscus, current injury, right knee, initial encounter: Secondary | ICD-10-CM | POA: Diagnosis not present

## 2021-06-07 DIAGNOSIS — S83231A Complex tear of medial meniscus, current injury, right knee, initial encounter: Secondary | ICD-10-CM | POA: Diagnosis not present

## 2021-07-07 ENCOUNTER — Encounter: Payer: Self-pay | Admitting: Family Medicine

## 2021-07-07 ENCOUNTER — Other Ambulatory Visit: Payer: Self-pay

## 2021-07-07 ENCOUNTER — Encounter: Payer: Medicare PPO | Admitting: General Practice

## 2021-07-07 ENCOUNTER — Ambulatory Visit: Payer: Medicare PPO | Admitting: Family Medicine

## 2021-07-07 ENCOUNTER — Ambulatory Visit: Payer: Medicare PPO | Admitting: Osteopathic Medicine

## 2021-07-07 VITALS — BP 122/69 | HR 65 | Temp 97.6°F | Ht 75.5 in | Wt 239.0 lb

## 2021-07-07 DIAGNOSIS — N400 Enlarged prostate without lower urinary tract symptoms: Secondary | ICD-10-CM

## 2021-07-07 DIAGNOSIS — E785 Hyperlipidemia, unspecified: Secondary | ICD-10-CM

## 2021-07-07 DIAGNOSIS — Z0000 Encounter for general adult medical examination without abnormal findings: Secondary | ICD-10-CM

## 2021-07-07 DIAGNOSIS — G4709 Other insomnia: Secondary | ICD-10-CM

## 2021-07-07 DIAGNOSIS — I10 Essential (primary) hypertension: Secondary | ICD-10-CM

## 2021-07-07 DIAGNOSIS — G4700 Insomnia, unspecified: Secondary | ICD-10-CM

## 2021-07-07 DIAGNOSIS — R519 Headache, unspecified: Secondary | ICD-10-CM

## 2021-07-07 MED ORDER — TOPIRAMATE 50 MG PO TABS: 50.0000 mg | ORAL_TABLET | Freq: Every day | ORAL | 3 refills | Status: AC

## 2021-07-07 MED ORDER — TRAZODONE HCL 50 MG PO TABS: 50.0000 mg | ORAL_TABLET | Freq: Every evening | ORAL | 1 refills | Status: DC | PRN

## 2021-07-07 MED ORDER — ATORVASTATIN CALCIUM 40 MG PO TABS: 40.0000 mg | ORAL_TABLET | Freq: Every day | ORAL | 3 refills | Status: AC

## 2021-07-07 MED ORDER — LISINOPRIL 40 MG PO TABS: 40.0000 mg | ORAL_TABLET | Freq: Every day | ORAL | 3 refills | Status: AC

## 2021-07-07 MED ORDER — GABAPENTIN 300 MG PO CAPS: 300.0000 mg | ORAL_CAPSULE | Freq: Three times a day (TID) | ORAL | 3 refills | Status: DC | PRN

## 2021-07-07 NOTE — Patient Instructions (Signed)
Preventive Care 65 Years and Older, Male Preventive care refers to lifestyle choices and visits with your health care provider that can promote health and wellness. This includes: A yearly physical exam. This is also called an annual wellness visit. Regular dental and eye exams. Immunizations. Screening for certain conditions. Healthy lifestyle choices, such as: Eating a healthy diet. Getting regular exercise. Not using drugs or products that contain nicotine and tobacco. Limiting alcohol use. What can I expect for my preventive care visit? Physical exam Your health care provider will check your: Height and weight. These may be used to calculate your BMI (body mass index). BMI is a measurement that tells if you are at a healthy weight. Heart rate and blood pressure. Body temperature. Skin for abnormal spots. Counseling Your health care provider may ask you questions about your: Past medical problems. Family's medical history. Alcohol, tobacco, and drug use. Emotional well-being. Home life and relationship well-being. Sexual activity. Diet, exercise, and sleep habits. History of falls. Memory and ability to understand (cognition). Work and work environment. Access to firearms. What immunizations do I need? Vaccines are usually given at various ages, according to a schedule. Your health care provider will recommend vaccines for you based on your age, medical history, and lifestyle or other factors, such as travel or where you work. What tests do I need? Blood tests Lipid and cholesterol levels. These may be checked every 5 years, or more often depending on your overall health. Hepatitis C test. Hepatitis B test. Screening Lung cancer screening. You may have this screening every year starting at age 55 if you have a 30-pack-year history of smoking and currently smoke or have quit within the past 15 years. Colorectal cancer screening. All adults should have this screening  starting at age 50 and continuing until age 75. Your health care provider may recommend screening at age 45 if you are at increased risk. You will have tests every 1-10 years, depending on your results and the type of screening test. Prostate cancer screening. Recommendations will vary depending on your family history and other risks. Genital exam to check for testicular cancer or hernias. Diabetes screening. This is done by checking your blood sugar (glucose) after you have not eaten for a while (fasting). You may have this done every 1-3 years. Abdominal aortic aneurysm (AAA) screening. You may need this if you are a current or former smoker. STD (sexually transmitted disease) testing, if you are at risk. Follow these instructions at home: Eating and drinking  Eat a diet that includes fresh fruits and vegetables, whole grains, lean protein, and low-fat dairy products. Limit your intake of foods with high amounts of sugar, saturated fats, and salt. Take vitamin and mineral supplements as recommended by your health care provider. Do not drink alcohol if your health care provider tells you not to drink. If you drink alcohol: Limit how much you have to 0-2 drinks a day. Be aware of how much alcohol is in your drink. In the [place] one drink equals one 12 oz bottle of beer (355 mL), one 5 oz glass of wine (148 mL), or one 1 oz glass of hard liquor (44 mL). Lifestyle Take daily care of your teeth and gums. Brush your teeth every morning and night with fluoride toothpaste. Floss one time each day. Stay active. Exercise for at least 30 minutes 5 or more days each week. Do not use any products that contain nicotine or tobacco, such as cigarettes, e-cigarettes, and chewing tobacco. If   you need help quitting, ask your health care provider. Do not use drugs. If you are sexually active, practice safe sex. Use a condom or other form of protection to prevent STIs (sexually transmitted infections). Talk  with your health care provider about taking a low-dose aspirin or statin. Find healthy ways to cope with stress, such as: Meditation, yoga, or listening to music. Journaling. Talking to a trusted person. Spending time with friends and family. Safety Always wear your seat belt while driving or riding in a vehicle. Do not drive: If you have been drinking alcohol. Do not ride with someone who has been drinking. When you are tired or distracted. While texting. Wear a helmet and other protective equipment during sports activities. If you have firearms in your house, make sure you follow all gun safety procedures. What's next? Visit your health care provider once a year for an annual wellness visit. Ask your health care provider how often you should have your eyes and teeth checked. Stay up to date on all vaccines. This information is not intended to replace advice given to you by your health care provider. Make sure you discuss any questions you have with your health care provider. Document Revised: 10/30/2020 Document Reviewed: 08/16/2018 Elsevier Patient Education  2022 Elsevier Inc.   

## 2021-07-08 DIAGNOSIS — Z0000 Encounter for general adult medical examination without abnormal findings: Secondary | ICD-10-CM | POA: Insufficient documentation

## 2021-07-08 LAB — COMPLETE METABOLIC PANEL WITH GFR
AG Ratio: 1.9 (calc) (ref 1.0–2.5)
ALT: 31 U/L (ref 9–46)
AST: 22 U/L (ref 10–35)
Albumin: 4.4 g/dL (ref 3.6–5.1)
Alkaline phosphatase (APISO): 83 U/L (ref 35–144)
BUN: 13 mg/dL (ref 7–25)
CO2: 27 mmol/L (ref 20–32)
Calcium: 9.1 mg/dL (ref 8.6–10.3)
Chloride: 107 mmol/L (ref 98–110)
Creat: 1.06 mg/dL (ref 0.70–1.28)
Globulin: 2.3 g/dL (calc) (ref 1.9–3.7)
Glucose, Bld: 106 mg/dL (ref 65–139)
Potassium: 4.5 mmol/L (ref 3.5–5.3)
Sodium: 142 mmol/L (ref 135–146)
Total Bilirubin: 0.6 mg/dL (ref 0.2–1.2)
Total Protein: 6.7 g/dL (ref 6.1–8.1)
eGFR: 72 mL/min/{1.73_m2} (ref >=60)

## 2021-07-08 LAB — CBC WITH DIFFERENTIAL/PLATELET
Absolute Monocytes: 281 cells/uL (ref 200–950)
Basophils Absolute: 19 cells/uL (ref 0–200)
Basophils Relative: 0.5 %
Eosinophils Absolute: 141 cells/uL (ref 15–500)
Eosinophils Relative: 3.7 %
HCT: 43.1 % (ref 38.5–50.0)
Hemoglobin: 14.2 g/dL (ref 13.2–17.1)
Lymphs Abs: 1372 cells/uL (ref 850–3900)
MCH: 30.9 pg (ref 27.0–33.0)
MCHC: 32.9 g/dL (ref 32.0–36.0)
MCV: 93.9 fL (ref 80.0–100.0)
MPV: 9.8 fL (ref 7.5–12.5)
Monocytes Relative: 7.4 %
Neutro Abs: 1987 cells/uL (ref 1500–7800)
Neutrophils Relative %: 52.3 %
Platelets: 160 10*3/uL (ref 140–400)
RBC: 4.59 10*6/uL (ref 4.20–5.80)
RDW: 13 % (ref 11.0–15.0)
Total Lymphocyte: 36.1 %
WBC: 3.8 10*3/uL (ref 3.8–10.8)

## 2021-07-08 LAB — LIPID PANEL W/REFLEX DIRECT LDL
Cholesterol: 108 mg/dL (ref <200)
HDL: 54 mg/dL (ref >=40)
LDL Cholesterol (Calc): 40 mg/dL (calc)
Non-HDL Cholesterol (Calc): 54 mg/dL (calc) (ref <130)
Total CHOL/HDL Ratio: 2 (calc) (ref <5.0)
Triglycerides: 61 mg/dL (ref <150)

## 2021-07-08 LAB — PSA: PSA: 1.51 ng/mL (ref <=4.00)

## 2021-07-08 NOTE — Assessment & Plan Note (Signed)
Well adult Orders Placed This Encounter  Procedures  . COMPLETE METABOLIC PANEL WITH GFR  . CBC with Differential  . Lipid Panel w/reflex Direct LDL  . PSA  Screening: PSA, lipid panel Immunizations: Up-to-date. Anticipatory guidance/risk factor reduction: Recommendations per AVS.

## 2021-07-08 NOTE — Progress Notes (Signed)
Miguel Glenn - 80 y.o. male MRN [id number]  Date of birth: 11/05/1940  Subjective Chief Complaint  Patient presents with   Annual Exam    HPI Miguel Glenn is a 80 year old male here today for initial visit with me.  He is transferring care from Dr. Lyn Hollingshead.  He reports overall he is doing fairly well.  He has no new concerns today.  He does need renewals on chronic medications.  He states he is doing well with this.  He does walk some for exercise but overall is very active.  He has been trying to follow a pretty healthy diet.  He is a former smoker, quit approximately 25 years ago.  He denies alcohol use.  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  Review of Systems  Constitutional:  Negative for chills, fever, malaise/fatigue and weight loss.  HENT:  Negative for congestion, ear pain and sore throat.   Eyes:  Negative for blurred vision, double vision and pain.  Respiratory:  Negative for cough and shortness of breath.   Cardiovascular:  Negative for chest pain and palpitations.  Gastrointestinal:  Negative for abdominal pain, blood in stool, constipation, heartburn and nausea.  Genitourinary:  Negative for dysuria and urgency.  Musculoskeletal:  Negative for joint pain and myalgias.  Neurological:  Negative for dizziness and headaches.  Endo/Heme/Allergies:  Does not bruise/bleed easily.  Psychiatric/Behavioral:  Negative for depression. The patient is not nervous/anxious and does not have insomnia.    No Known Allergies  Past Medical History:  Diagnosis Date   Asthma    Bladder spasm    CAD (coronary artery disease)    stents placed in [place] previously   Cat allergies    Chronic pancreatitis (HCC)    Colon polyps    hx   DDD (degenerative disc disease), lumbar    Depression with anxiety    DJD of shoulder    HLD (hyperlipidemia)    HTN (hypertension)    IFG (impaired fasting glucose)    Pneumonia    PUD (peptic ulcer disease)    gi bleed    Past Surgical  History:  Procedure Laterality Date   ABDOMINAL EXPLORATION SURGERY  1990s   APPENDECTOMY     CHOLECYSTECTOMY     HIATAL HERNIA REPAIR  1991   r shoulder replacement  12/10   Dr. Marlyne Beards   RTC surgery bilat      Social History   Socioeconomic History   Marital status: Married    Spouse name: Lea   Number of children: 4   Years of education: 12   Highest education level: GED or equivalent  Occupational History   Occupation: truck Hospital doctor  Tobacco Use   Smoking status: Former   Smokeless tobacco: Never   Tobacco comments:    quit 1996  Vaping Use   Vaping Use: Never used  Substance and Sexual Activity   Alcohol use: No   Drug use: Never   Sexual activity: Yes    Partners: Female  Other Topics Concern   Not on file  Social History Narrative   Patient does not exercise daily, 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   Social Determinants of Health   Financial Resource Strain: Low Risk    Difficulty of Paying Living Expenses: Not hard at all  Food Insecurity: No Food Insecurity   Worried About Programme researcher, broadcasting/film/video in the Last Year: Never true   Ran Out of Food in the Last Year: Never  true  Transportation Glenn: No Transportation Glenn   Lack of Transportation (Medical): No   Lack of Transportation (Non-Medical): No  Physical Activity: Insufficiently Active   Days of Exercise per Week: 5 days   Minutes of Exercise per Session: 20 min  Stress: No Stress Concern Present   Feeling of Stress : Not at all  Social Connections: Socially Integrated   Frequency of Communication with Friends and Family: More than three times a week   Frequency of Social Gatherings with Friends and Family: More than three times a week   Attends Religious Services: 1 to 4 times per year   Active Member of [employer] or Organizations: Yes   Attends Banker Meetings: 1 to 4 times per year   Marital Status: Married    Family History  Problem Relation Age of Onset   Lung cancer Mother     Hypertension Mother    Hyperlipidemia Mother    Hypertension Sister     Health Maintenance  Topic Date Due   Hepatitis C Screening  Never done   Zoster Vaccines- Shingrix (1 of 2) Never done   COVID-19 Vaccine (5 - Booster for Pfizer series) 07/17/2021   TETANUS/TDAP  06/17/2026   Pneumonia Vaccine 7+ Years old  Completed   INFLUENZA VACCINE  Completed   HPV VACCINES  Aged Out     ----------------------------------------------------------------------------------------------------------------------------------------------------------------------------------------------------------------- Physical Exam BP 122/69 (BP Location: Left Arm, Patient Position: Sitting, Cuff Size: Normal)   Pulse 65   Temp 97.6 F (36.4 C)   Ht 6' 3.5" (1.918 m)   Wt 239 lb (108.4 kg)   SpO2 98%   BMI 29.48 kg/m   Physical Exam Constitutional:      General: He is not in acute distress. HENT:     Head: Normocephalic and atraumatic.     Right Ear: Tympanic membrane and external ear normal.     Left Ear: Tympanic membrane and external ear normal.  Eyes:     General: No scleral icterus. Neck:     Thyroid: No thyromegaly.  Cardiovascular:     Rate and Rhythm: Normal rate and regular rhythm.     Heart sounds: Normal heart sounds.  Pulmonary:     Effort: Pulmonary effort is normal.     Breath sounds: Normal breath sounds.  Abdominal:     General: Bowel sounds are normal. There is no distension.     Palpations: Abdomen is soft.     Tenderness: There is no abdominal tenderness. There is no guarding.  Musculoskeletal:     Cervical back: Normal range of motion.  Lymphadenopathy:     Cervical: No cervical adenopathy.  Skin:    General: Skin is warm and dry.     Findings: No rash.  Neurological:     Mental Status: He is alert and oriented to person, place, and time.     Cranial Nerves: No cranial nerve deficit.     Motor: No abnormal muscle tone.  Psychiatric:        Mood and Affect: Mood  normal.        Behavior: Behavior normal.    ------------------------------------------------------------------------------------------------------------------------------------------------------------------------------------------------------------------- Assessment and Plan  Well adult exam Well adult Orders Placed This Encounter  Procedures   COMPLETE METABOLIC PANEL WITH GFR   CBC with Differential   Lipid Panel w/reflex Direct LDL   PSA  Screening: PSA, lipid panel Immunizations: Up-to-date. Anticipatory guidance/risk factor reduction: Recommendations per AVS.   Meds ordered this encounter  Medications   atorvastatin (  LIPITOR) 40 MG tablet    Sig: Take 1 tablet (40 mg total) by mouth daily.    Dispense:  90 tablet    Refill:  3   topiramate (TOPAMAX) 50 MG tablet    Sig: Take 1 tablet (50 mg total) by mouth daily.    Dispense:  90 tablet    Refill:  3   traZODone (DESYREL) 50 MG tablet    Sig: Take 1-2 tablets (50-100 mg total) by mouth at bedtime as needed. for sleep    Dispense:  90 tablet    Refill:  1   lisinopril (ZESTRIL) 40 MG tablet    Sig: Take 1 tablet (40 mg total) by mouth daily.    Dispense:  90 tablet    Refill:  3   gabapentin (NEURONTIN) 300 MG capsule    Sig: Take 1 capsule (300 mg total) by mouth 3 (three) times daily as needed.    Dispense:  90 capsule    Refill:  3    Return in about 6 months (around 01/04/2022) for HTN.    This visit occurred during the SARS-CoV-2 public health emergency.  Safety protocols were in place, including screening questions prior to the visit, additional usage of staff PPE, and extensive cleaning of exam room while observing appropriate contact time as indicated for disinfecting solutions.

## 2021-07-09 NOTE — Progress Notes (Signed)
This encounter was created in error - please disregard.

## 2021-07-11 ENCOUNTER — Other Ambulatory Visit: Payer: Self-pay | Admitting: Osteopathic Medicine

## 2021-07-11 DIAGNOSIS — G4700 Insomnia, unspecified: Secondary | ICD-10-CM

## 2021-08-02 ENCOUNTER — Ambulatory Visit (INDEPENDENT_AMBULATORY_CARE_PROVIDER_SITE_OTHER): Payer: Medicare PPO | Admitting: Family Medicine

## 2021-08-02 DIAGNOSIS — Z0000 Encounter for general adult medical examination without abnormal findings: Secondary | ICD-10-CM | POA: Diagnosis not present

## 2021-08-02 NOTE — Patient Instructions (Addendum)
MEDICARE ANNUAL WELLNESS VISIT Health Maintenance Summary and Written Plan of Care  [name] ,  Thank you for allowing me to perform your Medicare Annual Wellness Visit and for your ongoing commitment to your health.   Health Maintenance & Immunization History Health Maintenance  Topic Date Due   COVID-19 Vaccine (5 - Booster for Pfizer series) 08/18/2021 (Originally 07/17/2021)   Zoster Vaccines- Shingrix (1 of 2) 11/02/2021 (Originally 06/11/1960)   Hepatitis C Screening  08/02/2022 (Originally 06/11/1959)   TETANUS/TDAP  06/17/2026   Pneumonia Vaccine 67+ Years old  Completed   INFLUENZA VACCINE  Completed   HPV VACCINES  Aged Out   COLONOSCOPY (Pts 45-51yrs Insurance coverage will need to be confirmed)  Discontinued   Immunization History  Administered Date(s) Administered   19-influenza Whole 05/21/2019   Influenza Inj Mdck Quad Pf 05/22/2021   Influenza Split 05/31/2011, 07/17/2015, 06/03/2019   Influenza, High Dose Seasonal PF 07/10/2017, 06/28/2018, 05/21/2019   Influenza, Seasonal, Injecte, Preservative Fre 05/22/2021   Influenza,inj,Quad PF,6+ Mos 11/12/2013, 06/17/2016   Influenza-Unspecified 07/17/2015, 07/10/2017, 06/28/2018, 06/03/2019, 07/06/2020   PFIZER(Purple Top)SARS-COV-2 Vaccination 12/09/2019, 01/02/2020, 08/25/2020, 05/22/2021   Pneumococcal Conjugate-13 07/17/2015   Pneumococcal Polysaccharide-23 03/05/2009   Pneumococcal-Unspecified 03/05/2009   Td 09/05/2004   Tdap 06/17/2016    These are the patient goals that we discussed:  Goals Addressed               This Visit's Progress     Patient Stated (pt-stated)        08/02/2021 AWV Goal: Exercise for General Health  Patient will verbalize understanding of the benefits of increased physical activity: Exercising regularly is important. It will improve your overall fitness, flexibility, and endurance. Regular exercise also will improve your overall health. It can help you control your weight,  reduce stress, and improve your bone density. Over the next year, patient will increase physical activity as tolerated with a goal of at least 150 minutes of moderate physical activity per week.  You can tell that you are exercising at a moderate intensity if your heart starts beating faster and you start breathing faster but can still hold a conversation. Moderate-intensity exercise ideas include: Walking 1 mile (1.6 km) in about 15 minutes Biking Hiking Golfing Dancing Water aerobics Patient will verbalize understanding of everyday activities that increase physical activity by providing examples like the following: Yard work, such as: Insurance underwriter Gardening Washing windows or floors Patient will be able to explain general safety guidelines for exercising:  Before you start a new exercise program, talk with your health care provider. Do not exercise so much that you hurt yourself, feel dizzy, or get very short of breath. Wear comfortable clothes and wear shoes with good support. Drink plenty of water while you exercise to prevent dehydration or heat stroke. Work out until your breathing and your heartbeat get faster.          This is a list of Health Maintenance Items that are overdue or due now: Shingles vaccine  Orders/Referrals Placed Today: No orders of the defined types were placed in this encounter.  (Contact our referral department at [phone] if you have not spoken with someone about your referral appointment within the next 5 days)    Follow-up Plan Follow-up with [name], DO as planned Schedule your Shingles vaccine at the pharmacy. Medicare wellness visit in one year. Patient will access AVS on my chart.  Health Maintenance, Male Adopting a healthy lifestyle and getting preventive care are important in promoting health and wellness. Ask your health care provider  about: The right schedule for you to have regular tests and exams. Things you can do on your own to prevent diseases and keep yourself healthy. What should I know about diet, weight, and exercise? Eat a healthy diet  Eat a diet that includes plenty of vegetables, fruits, low-fat dairy products, and lean protein. Do not eat a lot of foods that are high in solid fats, added sugars, or sodium. Maintain a healthy weight Body mass index (BMI) is a measurement that can be used to identify possible weight problems. It estimates body fat based on height and weight. Your health care provider can help determine your BMI and help you achieve or maintain a healthy weight. Get regular exercise Get regular exercise. This is one of the most important things you can do for your health. Most adults should: Exercise for at least 150 minutes each week. The exercise should increase your heart rate and make you sweat (moderate-intensity exercise). Do strengthening exercises at least twice a week. This is in addition to the moderate-intensity exercise. Spend less time sitting. Even light physical activity can be beneficial. Watch cholesterol and blood lipids Have your blood tested for lipids and cholesterol at 78 years of age, then have this test every 5 years. You may need to have your cholesterol levels checked more often if: Your lipid or cholesterol levels are high. You are older than 78 years of age. You are at high risk for heart disease. What should I know about cancer screening? Many types of cancers can be detected early and may often be prevented. Depending on your health history and family history, you may need to have cancer screening at various ages. This may include screening for: Colorectal cancer. Prostate cancer. Skin cancer. Lung cancer. What should I know about heart disease, diabetes, and high blood pressure? Blood pressure and heart disease High blood pressure causes heart disease and  increases the risk of stroke. This is more likely to develop in people who have high blood pressure readings or are overweight. Talk with your health care provider about your target blood pressure readings. Have your blood pressure checked: Every 3-5 years if you are 49-71 years of age. Every year if you are 39 years old or older. If you are between the ages of 48 and 16 and are a current or former smoker, ask your health care provider if you should have a one-time screening for abdominal aortic aneurysm (AAA). Diabetes Have regular diabetes screenings. This checks your fasting blood sugar level. Have the screening done: Once every three years after age 49 if you are at a normal weight and have a low risk for diabetes. More often and at a younger age if you are overweight or have a high risk for diabetes. What should I know about preventing infection? Hepatitis B If you have a higher risk for hepatitis B, you should be screened for this virus. Talk with your health care provider to find out if you are at risk for hepatitis B infection. Hepatitis C Blood testing is recommended for: Everyone born from [birth year] through 1963. Anyone with known risk factors for hepatitis C. Sexually transmitted infections (STIs) You should be screened each year for STIs, including gonorrhea and chlamydia, if: You are sexually active and are younger than 78 years of age. You are older than 78 years of age and  your health care provider tells you that you are at risk for this type of infection. Your sexual activity has changed since you were last screened, and you are at increased risk for chlamydia or gonorrhea. Ask your health care provider if you are at risk. Ask your health care provider about whether you are at high risk for HIV. Your health care provider may recommend a prescription medicine to help prevent HIV infection. If you choose to take medicine to prevent HIV, you should first get tested for HIV. You should  then be tested every 3 months for as long as you are taking the medicine. Follow these instructions at home: Alcohol use Do not drink alcohol if your health care provider tells you not to drink. If you drink alcohol: Limit how much you have to 0-2 drinks a day. Know how much alcohol is in your drink. In the [place] one drink equals one 12 oz bottle of beer (355 mL), one 5 oz glass of wine (148 mL), or one 1 oz glass of hard liquor (44 mL). Lifestyle Do not use any products that contain nicotine or tobacco. These products include cigarettes, chewing tobacco, and vaping devices, such as e-cigarettes. If you need help quitting, ask your health care provider. Do not use street drugs. Do not share needles. Ask your health care provider for help if you need support or information about quitting drugs. General instructions Schedule regular health, dental, and eye exams. Stay current with your vaccines. Tell your health care provider if: You often feel depressed. You have ever been abused or do not feel safe at home. Summary Adopting a healthy lifestyle and getting preventive care are important in promoting health and wellness. Follow your health care provider's instructions about healthy diet, exercising, and getting tested or screened for diseases. Follow your health care provider's instructions on monitoring your cholesterol and blood pressure. This information is not intended to replace advice given to you by your health care provider. Make sure you discuss any questions you have with your health care provider. Document Revised: 01/11/2021 Document Reviewed: 01/11/2021 Elsevier Patient Education  2022 [employer].

## 2021-08-02 NOTE — Progress Notes (Signed)
MEDICARE ANNUAL WELLNESS VISIT  08/02/2021  Telephone Visit Disclaimer This Medicare AWV was conducted by telephone due to national recommendations for restrictions regarding the COVID-19 Pandemic (e.g. social distancing).  I verified, using two identifiers, that I am speaking with Miguel Glenn or their authorized healthcare agent. I discussed the limitations, risks, security, and privacy concerns of performing an evaluation and management service by telephone and the potential availability of an in-person appointment in the future. The patient expressed understanding and agreed to proceed.  Location of Patient: In his truck Location of Provider (nurse):  Provider home  Subjective:    Miguel Glenn is a 80 y.o. male patient of Miguel Coombe, DO who had a Medicare Annual Wellness Visit today via telephone. [name] is Working full time and lives with their spouse. he has 4 children. he reports that he is socially active and does interact with friends/family regularly. he is minimally physically active and enjoys going to see races.  Patient Care Team: Miguel Coombe, DO as PCP - General (Family Medicine)  Advanced Directives 08/02/2021 07/27/2020 07/22/2019 07/18/2018  Does Patient Have a Medical Advance Directive? Yes Yes Yes Yes  Type of Advance Directive Living will;Healthcare Power of [employer] Power of [place];Living will Healthcare Power of [place];Living will Healthcare Power of [place];Living will  Does patient want to make changes to medical advance directive? No - Patient declined No - Patient declined No - Patient declined No - Patient declined  Copy of Healthcare Power of Attorney in Chart? No - copy requested No - copy requested No - copy requested No - copy requested    Hospital Utilization Over the Past 12 Months: # of hospitalizations or ER visits: 1 # of surgeries: 1  Review of Systems    Patient reports that his overall health is unchanged compared  to last year.  History obtained from chart review and the patient  Patient Reported Readings (BP, Pulse, CBG, Weight, etc) none  Pain Assessment Pain : No/denies pain     Current Medications & Allergies (verified) Allergies as of 08/02/2021   No Known Allergies      Medication List        Accurate as of August 02, 2021 10:22 AM. If you have any questions, ask your nurse or doctor.          alfuzosin 10 MG 24 hr tablet Commonly known as: UROXATRAL Take 1 tablet (10 mg total) by mouth daily with breakfast.   aspirin 325 MG tablet Take 1 tablet (325 mg total) by mouth daily.   aspirin EC 81 MG tablet Take 1 tablet (81 mg total) by mouth daily.   atorvastatin 40 MG tablet Commonly known as: LIPITOR Take 1 tablet (40 mg total) by mouth daily.   gabapentin 300 MG capsule Commonly known as: NEURONTIN Take 1 capsule (300 mg total) by mouth 3 (three) times daily as needed.   HYDROcodone-acetaminophen 5-325 MG tablet Commonly known as: NORCO/VICODIN Take by mouth.   lisinopril 40 MG tablet Commonly known as: ZESTRIL Take 1 tablet (40 mg total) by mouth daily.   MULTIPLE VITAMIN PO Take by mouth.   pantoprazole 40 MG tablet Commonly known as: PROTONIX Take by mouth.   topiramate 50 MG tablet Commonly known as: TOPAMAX Take 1 tablet (50 mg total) by mouth daily.   traZODone 50 MG tablet Commonly known as: DESYREL Take 1-2 tablets (50-100 mg total) by mouth at bedtime as needed. for sleep   zolpidem 10 MG tablet  Commonly known as: AMBIEN TAKE 1 TABLET BY MOUTH AT BEDTIME AS NEEDED FOR SLEEP        History (reviewed): Past Medical History:  Diagnosis Date   Asthma    Bladder spasm    CAD (coronary artery disease)    stents placed in [place] previously   Cat allergies    Chronic pancreatitis (HCC)    Colon polyps    hx   DDD (degenerative disc disease), lumbar    Depression with anxiety    DJD of shoulder    HLD (hyperlipidemia)    HTN  (hypertension)    IFG (impaired fasting glucose)    Pneumonia    PUD (peptic ulcer disease)    gi bleed   Past Surgical History:  Procedure Laterality Date   ABDOMINAL EXPLORATION SURGERY  1990s   APPENDECTOMY     CHOLECYSTECTOMY     HIATAL HERNIA REPAIR  1991   r shoulder replacement  12/10   Dr. Marlyne Beards   RTC surgery bilat     Family History  Problem Relation Age of Onset   Lung cancer Mother    Hypertension Mother    Hyperlipidemia Mother    Hypertension Sister    Social History   Socioeconomic History   Marital status: Married    Spouse name: Leah   Number of children: 4   Years of education: 12   Highest education level: GED or equivalent  Occupational History   Occupation: truck Hospital doctor    Comment: full time  Tobacco Use   Smoking status: Former   Smokeless tobacco: Never   Tobacco comments:    quit 1996  Vaping Use   Vaping Use: Never used  Substance and Sexual Activity   Alcohol use: No   Drug use: Never   Sexual activity: Yes    Partners: Female  Other Topics Concern   Not on file  Social History Narrative   Lives with his wife. He is still working full time (50-60 hours per week). He has four children. He enjoys watching races.    Social Determinants of Health   Financial Resource Strain: Low Risk    Difficulty of Paying Living Expenses: Not hard at all  Food Insecurity: No Food Insecurity   Worried About Programme researcher, broadcasting/film/video in the Last Year: Never true   Ran Out of Food in the Last Year: Never true  Transportation Needs: No Transportation Needs   Lack of Transportation (Medical): No   Lack of Transportation (Non-Medical): No  Physical Activity: Inactive   Days of Exercise per Week: 0 days   Minutes of Exercise per Session: 0 min  Stress: No Stress Concern Present   Feeling of Stress : Not at all  Social Connections: Moderately Integrated   Frequency of Communication with Friends and Family: More than three times a week   Frequency of  Social Gatherings with Friends and Family: More than three times a week   Attends Religious Services: More than 4 times per year   Active Member of [employer] or Organizations: No   Attends Banker Meetings: Never   Marital Status: Married    Activities of Daily Living In your present state of health, do you have any difficulty performing the following activities: 08/02/2021  Hearing? N  Comment bilateral hearing aids.  Vision? N  Comment wears glasses.  Difficulty concentrating or making decisions? N  Walking or climbing stairs? N  Dressing or bathing? N  Doing errands, shopping? N  Preparing Food and eating ? N  Using the Toilet? N  In the past six months, have you accidently leaked urine? N  Do you have problems with loss of bowel control? N  Managing your Medications? N  Managing your Finances? N  Housekeeping or managing your Housekeeping? N  Some recent data might be hidden    Patient Education/ Literacy How often do you need to have someone help you when you read instructions, pamphlets, or other written materials from your doctor or pharmacy?: 1 - Never What is the last grade level you completed in school?: 12th grade  Exercise Current Exercise Habits: Home exercise routine, Type of exercise: walking, Time (Minutes): 30, Frequency (Times/Week): 2, Weekly Exercise (Minutes/Week): 60, Intensity: Mild, Exercise limited by: None identified  Diet Patient reports consuming 2 meals a day and 0 snack(s) a day Patient reports that his primary diet is: Regular Patient reports that she does have regular access to food.   Depression Screen PHQ 2/9 Scores 08/02/2021 07/27/2020 01/16/2019 07/18/2018 07/18/2018 07/18/2016 11/12/2013  PHQ - 2 Score 0 0 0 0 0 0 0  PHQ- 9 Score - - - - - 2 -     Fall Risk Fall Risk  08/02/2021 07/07/2021 07/27/2020 07/22/2019 07/18/2018  Falls in the past year? 0 0 0 0 0  Number falls in past yr: 0 0 0 0 -  Injury with Fall? 0 0 0 0 -   Risk for fall due to : No Fall Risks No Fall Risks - - -  Follow up Falls evaluation completed Falls evaluation completed Falls evaluation completed Falls prevention discussed -     Objective:  Miguel Glenn seemed alert and oriented and he participated appropriately during our telephone visit.  Blood Pressure Weight BMI  BP Readings from Last 3 Encounters:  07/07/21 122/69  01/04/21 133/73  07/27/20 127/81   Wt Readings from Last 3 Encounters:  07/07/21 239 lb (108.4 kg)  01/04/21 248 lb 1.3 oz (112.5 kg)  07/27/20 244 lb 1.6 oz (110.7 kg)   BMI Readings from Last 1 Encounters:  07/07/21 29.48 kg/m    *Unable to obtain current vital signs, weight, and BMI due to telephone visit type  Hearing/Vision  [place] did not seem to have difficulty with hearing/understanding during the telephone conversation Reports that he has had a formal eye exam by an eye care professional within the past year Reports that he has had a formal hearing evaluation within the past year *Unable to fully assess hearing and vision during telephone visit type  Cognitive Function: 6CIT Screen 08/02/2021 07/27/2020 07/22/2019 07/18/2018 07/10/2017  What Year? 0 points 0 points 0 points 0 points 0 points  What month? 0 points 0 points 0 points 0 points 0 points  What time? 0 points 0 points 0 points 0 points 0 points  Count back from 20 0 points 0 points 0 points 0 points 0 points  Months in reverse 0 points 0 points 2 points 2 points 0 points  Repeat phrase 0 points 0 points 0 points 0 points 4 points  Total Score 0 0 2 2 4    (Normal:0-7, Significant for Dysfunction: >8)  Normal Cognitive Function Screening: Yes   Immunization & Health Maintenance Record Immunization History  Administered Date(s) Administered   19-influenza Whole 05/21/2019   Influenza Inj Mdck Quad Pf 05/22/2021   Influenza Split 05/31/2011, 07/17/2015, 06/03/2019   Influenza, High Dose Seasonal PF 07/10/2017, 06/28/2018,  05/21/2019   Influenza, Seasonal, Injecte, Preservative  Fre 05/22/2021   Influenza,inj,Quad PF,6+ Mos 11/12/2013, 06/17/2016   Influenza-Unspecified 07/17/2015, 07/10/2017, 06/28/2018, 06/03/2019, 07/06/2020   PFIZER(Purple Top)SARS-COV-2 Vaccination 12/09/2019, 01/02/2020, 08/25/2020, 05/22/2021   Pneumococcal Conjugate-13 07/17/2015   Pneumococcal Polysaccharide-23 03/05/2009   Pneumococcal-Unspecified 03/05/2009   Td 09/05/2004   Tdap 06/17/2016    Health Maintenance  Topic Date Due   COVID-19 Vaccine (5 - Booster for Pfizer series) 08/18/2021 (Originally 07/17/2021)   Zoster Vaccines- Shingrix (1 of 2) 11/02/2021 (Originally 06/11/1960)   Hepatitis C Screening  08/02/2022 (Originally 06/11/1959)   TETANUS/TDAP  06/17/2026   Pneumonia Vaccine 75+ Years old  Completed   INFLUENZA VACCINE  Completed   HPV VACCINES  Aged Out   COLONOSCOPY (Pts 45-24yrs Insurance coverage will need to be confirmed)  Discontinued       Assessment  This is a routine wellness examination for [employer].  Health Maintenance: Due or Overdue There are no preventive care reminders to display for this patient.   Miguel Glenn does not need a referral for Community Assistance: Care Management:   no Social Work:    no Prescription Assistance:  no Nutrition/Diabetes Education:  no   Plan:  Personalized Goals  Goals Addressed               This Visit's Progress     Patient Stated (pt-stated)        08/02/2021 AWV Goal: Exercise for General Health  Patient will verbalize understanding of the benefits of increased physical activity: Exercising regularly is important. It will improve your overall fitness, flexibility, and endurance. Regular exercise also will improve your overall health. It can help you control your weight, reduce stress, and improve your bone density. Over the next year, patient will increase physical activity as tolerated with a goal of at least 150 minutes of moderate  physical activity per week.  You can tell that you are exercising at a moderate intensity if your heart starts beating faster and you start breathing faster but can still hold a conversation. Moderate-intensity exercise ideas include: Walking 1 mile (1.6 km) in about 15 minutes Biking Hiking Golfing Dancing Water aerobics Patient will verbalize understanding of everyday activities that increase physical activity by providing examples like the following: Yard work, such as: Insurance underwriter Gardening Washing windows or floors Patient will be able to explain general safety guidelines for exercising:  Before you start a new exercise program, talk with your health care provider. Do not exercise so much that you hurt yourself, feel dizzy, or get very short of breath. Wear comfortable clothes and wear shoes with good support. Drink plenty of water while you exercise to prevent dehydration or heat stroke. Work out until your breathing and your heartbeat get faster.        Personalized Health Maintenance & Screening Recommendations  Shingles vaccine  Lung Cancer Screening Recommended: no (Low Dose CT Chest recommended if Age 71-80 years, 30 pack-year currently smoking OR have quit w/in past 15 years) Hepatitis C Screening recommended: yes HIV Screening recommended: no  Advanced Directives: Written information was not prepared per patient's request.  Referrals & Orders No orders of the defined types were placed in this encounter.   Follow-up Plan Follow-up with Miguel Coombe, DO as planned Schedule your Shingles vaccine at the pharmacy. Medicare wellness visit in one year. Patient will access AVS on my chart.   I have personally reviewed and noted the following  in the patient's chart:   Medical and social history Use of alcohol, tobacco or illicit drugs  Current medications and  supplements Functional ability and status Nutritional status Physical activity Advanced directives List of other physicians Hospitalizations, surgeries, and ER visits in previous 12 months Vitals Screenings to include cognitive, depression, and falls Referrals and appointments  In addition, I have reviewed and discussed with Miguel Glenn certain preventive protocols, quality metrics, and best practice recommendations. A written personalized care plan for preventive services as well as general preventive health recommendations is available and can be mailed to the patient at his request.      Modesto Charon, RN  08/02/2021

## 2021-08-28 ENCOUNTER — Other Ambulatory Visit: Payer: Self-pay | Admitting: Nurse Practitioner

## 2021-08-28 DIAGNOSIS — N3281 Overactive bladder: Secondary | ICD-10-CM

## 2021-08-28 DIAGNOSIS — N400 Enlarged prostate without lower urinary tract symptoms: Secondary | ICD-10-CM

## 2021-09-02 ENCOUNTER — Other Ambulatory Visit: Payer: Self-pay

## 2021-09-02 DIAGNOSIS — N3281 Overactive bladder: Secondary | ICD-10-CM

## 2021-09-02 DIAGNOSIS — N400 Enlarged prostate without lower urinary tract symptoms: Secondary | ICD-10-CM

## 2021-09-02 MED ORDER — ALFUZOSIN HCL ER 10 MG PO TB24: 10.0000 mg | ORAL_TABLET | Freq: Every day | ORAL | 3 refills | Status: AC

## 2021-10-09 ENCOUNTER — Other Ambulatory Visit: Payer: Self-pay | Admitting: Family Medicine

## 2021-10-09 DIAGNOSIS — G4700 Insomnia, unspecified: Secondary | ICD-10-CM

## 2021-11-07 DIAGNOSIS — H1031 Unspecified acute conjunctivitis, right eye: Secondary | ICD-10-CM | POA: Diagnosis not present

## 2021-12-26 ENCOUNTER — Other Ambulatory Visit: Payer: Self-pay | Admitting: Family Medicine

## 2021-12-26 DIAGNOSIS — G4709 Other insomnia: Secondary | ICD-10-CM

## 2022-01-04 ENCOUNTER — Ambulatory Visit: Payer: Medicare PPO | Admitting: Family Medicine

## 2022-01-07 ENCOUNTER — Other Ambulatory Visit: Payer: Self-pay | Admitting: Family Medicine

## 2022-01-07 DIAGNOSIS — G4700 Insomnia, unspecified: Secondary | ICD-10-CM

## 2022-03-16 DIAGNOSIS — R29818 Other symptoms and signs involving the nervous system: Secondary | ICD-10-CM | POA: Diagnosis not present

## 2022-03-22 ENCOUNTER — Other Ambulatory Visit: Payer: Self-pay | Admitting: Family Medicine

## 2022-03-23 NOTE — Telephone Encounter (Signed)
Patient has been scheduled for PCP's next opening on 04/19/22. AMUCK

## 2022-03-23 NOTE — Telephone Encounter (Signed)
Pls contact pt to schedule past due appt for HTN. Due 01/2022.   Sending refill. Thanks

## 2022-03-26 DIAGNOSIS — R4781 Slurred speech: Secondary | ICD-10-CM | POA: Diagnosis not present

## 2022-03-26 DIAGNOSIS — R059 Cough, unspecified: Secondary | ICD-10-CM | POA: Diagnosis not present

## 2022-03-26 DIAGNOSIS — I708 Atherosclerosis of other arteries: Secondary | ICD-10-CM | POA: Diagnosis not present

## 2022-03-26 DIAGNOSIS — R001 Bradycardia, unspecified: Secondary | ICD-10-CM | POA: Diagnosis not present

## 2022-03-26 DIAGNOSIS — G319 Degenerative disease of nervous system, unspecified: Secondary | ICD-10-CM | POA: Diagnosis not present

## 2022-03-26 DIAGNOSIS — R0602 Shortness of breath: Secondary | ICD-10-CM | POA: Diagnosis not present

## 2022-03-26 DIAGNOSIS — K8689 Other specified diseases of pancreas: Secondary | ICD-10-CM | POA: Diagnosis not present

## 2022-03-26 DIAGNOSIS — R0789 Other chest pain: Secondary | ICD-10-CM | POA: Diagnosis not present

## 2022-03-26 DIAGNOSIS — R5383 Other fatigue: Secondary | ICD-10-CM | POA: Diagnosis not present

## 2022-03-26 DIAGNOSIS — Z79899 Other long term (current) drug therapy: Secondary | ICD-10-CM | POA: Diagnosis not present

## 2022-03-26 DIAGNOSIS — J8410 Pulmonary fibrosis, unspecified: Secondary | ICD-10-CM | POA: Diagnosis not present

## 2022-03-26 DIAGNOSIS — R400 Somnolence: Secondary | ICD-10-CM | POA: Diagnosis not present

## 2022-03-26 DIAGNOSIS — R4182 Altered mental status, unspecified: Secondary | ICD-10-CM | POA: Diagnosis not present

## 2022-03-26 DIAGNOSIS — R531 Weakness: Secondary | ICD-10-CM | POA: Diagnosis not present

## 2022-03-26 DIAGNOSIS — R519 Headache, unspecified: Secondary | ICD-10-CM | POA: Diagnosis not present

## 2022-03-26 DIAGNOSIS — Z87891 Personal history of nicotine dependence: Secondary | ICD-10-CM | POA: Diagnosis not present

## 2022-04-14 ENCOUNTER — Other Ambulatory Visit: Payer: Self-pay | Admitting: Family Medicine

## 2022-04-14 DIAGNOSIS — I2510 Atherosclerotic heart disease of native coronary artery without angina pectoris: Secondary | ICD-10-CM | POA: Diagnosis not present

## 2022-04-14 DIAGNOSIS — Z955 Presence of coronary angioplasty implant and graft: Secondary | ICD-10-CM | POA: Diagnosis not present

## 2022-04-14 DIAGNOSIS — E785 Hyperlipidemia, unspecified: Secondary | ICD-10-CM | POA: Diagnosis not present

## 2022-04-19 ENCOUNTER — Ambulatory Visit: Payer: Medicare PPO | Admitting: Family Medicine

## 2022-04-29 DIAGNOSIS — I2510 Atherosclerotic heart disease of native coronary artery without angina pectoris: Secondary | ICD-10-CM | POA: Diagnosis not present

## 2022-04-29 DIAGNOSIS — G44009 Cluster headache syndrome, unspecified, not intractable: Secondary | ICD-10-CM | POA: Diagnosis not present

## 2022-04-29 DIAGNOSIS — E782 Mixed hyperlipidemia: Secondary | ICD-10-CM | POA: Diagnosis not present

## 2022-04-29 DIAGNOSIS — G629 Polyneuropathy, unspecified: Secondary | ICD-10-CM | POA: Diagnosis not present

## 2022-04-29 DIAGNOSIS — N401 Enlarged prostate with lower urinary tract symptoms: Secondary | ICD-10-CM | POA: Diagnosis not present

## 2022-04-29 DIAGNOSIS — R3916 Straining to void: Secondary | ICD-10-CM | POA: Diagnosis not present

## 2022-04-29 DIAGNOSIS — I10 Essential (primary) hypertension: Secondary | ICD-10-CM | POA: Diagnosis not present

## 2022-04-29 DIAGNOSIS — N182 Chronic kidney disease, stage 2 (mild): Secondary | ICD-10-CM | POA: Diagnosis not present

## 2022-05-30 ENCOUNTER — Encounter: Payer: Self-pay | Admitting: *Deleted

## 2022-05-30 ENCOUNTER — Telehealth: Payer: Self-pay | Admitting: *Deleted

## 2022-05-30 NOTE — Patient Outreach (Signed)
  Care Coordination   05/30/2022 Name: NICHOLOS ALOISI MRN: [id number] DOB: 10/02/1940   Care Coordination Outreach Attempts:  An unsuccessful telephone outreach was attempted today to offer the patient information about available care coordination services as a benefit of their health plan.   Follow Up Plan:  Additional outreach attempts will be made to offer the patient care coordination information and services.   Encounter Outcome:  No Answer left voice message requesting call back  Care Coordination Interventions Activated:  No   Care Coordination Interventions:  No, not indicated unsuccessful outreach attempt   Oneta Rack, RN, BSN, CCRN Alumnus RN CM Care Coordination/ Transition of [place] Management [phone]: direct office

## 2022-06-03 ENCOUNTER — Encounter: Payer: Self-pay | Admitting: *Deleted

## 2022-06-03 ENCOUNTER — Telehealth: Payer: Self-pay | Admitting: *Deleted

## 2022-06-03 DIAGNOSIS — G629 Polyneuropathy, unspecified: Secondary | ICD-10-CM | POA: Diagnosis not present

## 2022-06-03 NOTE — Patient Outreach (Signed)
  Care Coordination   Initial Visit Note   06/03/2022 Name: Miguel Glenn MRN: [id number] DOB: April 11, 1941  Miguel Glenn is a 81 y.o. year old male who sees Luetta Nutting, DO for primary care. I spoke with  Rogers Blocker by phone today.  What matters to the patients health and wellness today?  "Thank you for calling, I am doing just fine and everything is going good.... I have switched my Primary Care to [facility] because I moved to [facility] and it's just too much driving to go to [place] or [place] for my care.  Please thank all of the doctosr at [facility] for all the excellent care they gave me over the years  SDOH assessments and interventions completed:  No   Care Coordination Interventions Activated:  No  Care Coordination Interventions:  No, not indicated   Follow up plan: No further intervention required.   Encounter Outcome:  Pt. Visit Completed   Oneta Rack, RN, BSN, CCRN Alumnus RN CM Care Coordination/ Transition of [place] Management [phone]: direct office

## 2022-06-28 DIAGNOSIS — G629 Polyneuropathy, unspecified: Secondary | ICD-10-CM | POA: Diagnosis not present

## 2023-11-04 DEATH — deceased
